# Patient Record
Sex: Female | Born: 1980 | Race: White | Hispanic: No | Marital: Married | Smoking: Never smoker
Health system: Southern US, Community
[De-identification: ages and names within clinical notes are randomized; demographics above are authoritative.]

## PROBLEM LIST (undated history)

## (undated) DIAGNOSIS — K429 Umbilical hernia without obstruction or gangrene: Secondary | ICD-10-CM

## (undated) DIAGNOSIS — F419 Anxiety disorder, unspecified: Secondary | ICD-10-CM

## (undated) DIAGNOSIS — E282 Polycystic ovarian syndrome: Secondary | ICD-10-CM

## (undated) DIAGNOSIS — F988 Other specified behavioral and emotional disorders with onset usually occurring in childhood and adolescence: Secondary | ICD-10-CM

## (undated) DIAGNOSIS — IMO0002 Reserved for concepts with insufficient information to code with codable children: Secondary | ICD-10-CM

## (undated) HISTORY — PX: NO PAST SURGERIES: SHX2092

## (undated) HISTORY — DX: Polycystic ovarian syndrome: E28.2

## (undated) HISTORY — DX: Other specified behavioral and emotional disorders with onset usually occurring in childhood and adolescence: F98.8

## (undated) HISTORY — DX: Anxiety disorder, unspecified: F41.9

## (undated) HISTORY — DX: Reserved for concepts with insufficient information to code with codable children: IMO0002

---

## 2009-06-10 DIAGNOSIS — IMO0002 Reserved for concepts with insufficient information to code with codable children: Secondary | ICD-10-CM

## 2009-06-10 HISTORY — DX: Reserved for concepts with insufficient information to code with codable children: IMO0002

## 2011-12-13 DIAGNOSIS — E282 Polycystic ovarian syndrome: Secondary | ICD-10-CM | POA: Insufficient documentation

## 2011-12-23 LAB — OB RESULTS CONSOLE HIV ANTIBODY (ROUTINE TESTING): HIV: NONREACTIVE

## 2011-12-23 LAB — OB RESULTS CONSOLE ABO/RH: RH Type: POSITIVE

## 2011-12-23 LAB — OB RESULTS CONSOLE GC/CHLAMYDIA
Chlamydia: NEGATIVE
Gonorrhea: NEGATIVE

## 2011-12-23 LAB — OB RESULTS CONSOLE RUBELLA ANTIBODY, IGM: Rubella: IMMUNE

## 2011-12-23 LAB — OB RESULTS CONSOLE HEPATITIS B SURFACE ANTIGEN: Hepatitis B Surface Ag: NEGATIVE

## 2011-12-23 LAB — OB RESULTS CONSOLE HGB/HCT, BLOOD: Hemoglobin: 11.8 g/dL

## 2012-01-17 ENCOUNTER — Ambulatory Visit (INDEPENDENT_AMBULATORY_CARE_PROVIDER_SITE_OTHER): Payer: BC Managed Care – PPO | Admitting: Family

## 2012-01-17 ENCOUNTER — Encounter: Payer: Self-pay | Admitting: Family

## 2012-01-17 VITALS — BP 90/57 | Temp 97.3°F | Ht 64.0 in | Wt 136.0 lb

## 2012-01-17 DIAGNOSIS — Z3490 Encounter for supervision of normal pregnancy, unspecified, unspecified trimester: Secondary | ICD-10-CM

## 2012-01-17 DIAGNOSIS — IMO0002 Reserved for concepts with insufficient information to code with codable children: Secondary | ICD-10-CM

## 2012-01-17 DIAGNOSIS — D369 Benign neoplasm, unspecified site: Secondary | ICD-10-CM | POA: Insufficient documentation

## 2012-01-17 DIAGNOSIS — Z87898 Personal history of other specified conditions: Secondary | ICD-10-CM | POA: Insufficient documentation

## 2012-01-17 DIAGNOSIS — Z3480 Encounter for supervision of other normal pregnancy, unspecified trimester: Secondary | ICD-10-CM

## 2012-01-17 DIAGNOSIS — F988 Other specified behavioral and emotional disorders with onset usually occurring in childhood and adolescence: Secondary | ICD-10-CM

## 2012-01-17 DIAGNOSIS — R6889 Other general symptoms and signs: Secondary | ICD-10-CM

## 2012-01-17 NOTE — Progress Notes (Signed)
p-[id number] 

## 2012-01-17 NOTE — Progress Notes (Signed)
Transfer from [place], desires waterbirth; picking a doula at this time.  Desires sex of baby to be unknown.  Reviewed medical/OB history, no concerns.  Declines genetic testing.  Derryl Harbor (daughter) starts kindergarten this year.

## 2012-01-28 ENCOUNTER — Encounter: Payer: Self-pay | Admitting: Family

## 2012-01-28 ENCOUNTER — Encounter: Payer: Self-pay | Admitting: *Deleted

## 2012-02-14 ENCOUNTER — Ambulatory Visit (INDEPENDENT_AMBULATORY_CARE_PROVIDER_SITE_OTHER): Payer: BC Managed Care – PPO | Admitting: Advanced Practice Midwife

## 2012-02-14 VITALS — BP 107/58 | Wt 143.0 lb

## 2012-02-14 DIAGNOSIS — Z3480 Encounter for supervision of other normal pregnancy, unspecified trimester: Secondary | ICD-10-CM

## 2012-02-14 DIAGNOSIS — O9989 Other specified diseases and conditions complicating pregnancy, childbirth and the puerperium: Secondary | ICD-10-CM

## 2012-02-14 DIAGNOSIS — R51 Headache: Secondary | ICD-10-CM

## 2012-02-14 DIAGNOSIS — O26899 Other specified pregnancy related conditions, unspecified trimester: Secondary | ICD-10-CM

## 2012-02-14 DIAGNOSIS — D369 Benign neoplasm, unspecified site: Secondary | ICD-10-CM

## 2012-02-14 DIAGNOSIS — Z3490 Encounter for supervision of normal pregnancy, unspecified, unspecified trimester: Secondary | ICD-10-CM

## 2012-02-14 MED ORDER — BUTALBITAL-APAP-CAFFEINE 50-325-40 MG PO TABS: 1.0000 | ORAL_TABLET | Freq: Four times a day (QID) | ORAL | Status: AC | PRN

## 2012-02-14 NOTE — Patient Instructions (Signed)
Pregnancy - Second Trimester The second trimester of pregnancy (3 to 6 months) is a period of rapid growth for you and your baby. At the end of the sixth month, your baby is about 9 inches long and weighs 1 1/2 pounds. You will begin to feel the baby move between 18 and 20 weeks of the pregnancy. This is called quickening. Weight gain is faster. A clear fluid (colostrum) may leak out of your breasts. You may feel small contractions of the womb (uterus). This is known as false labor or Braxton-Hicks contractions. This is like a practice for labor when the baby is ready to be born. Usually, the problems with morning sickness have usually passed by the end of your first trimester. Some women develop small dark blotches (called cholasma, mask of pregnancy) on their face that usually goes away after the baby is born. Exposure to the sun makes the blotches worse. Acne may also develop in some pregnant women and pregnant women who have acne, may find that it goes away. PRENATAL EXAMS  Blood work may continue to be done during prenatal exams. These tests are done to check on your health and the probable health of your baby. Blood work is used to follow your blood levels (hemoglobin). Anemia (low hemoglobin) is common during pregnancy. Iron and vitamins are given to help prevent this. You will also be checked for diabetes between 24 and 28 weeks of the pregnancy. Some of the previous blood tests may be repeated.   The size of the uterus is measured during each visit. This is to make sure that the baby is continuing to grow properly according to the dates of the pregnancy.   Your blood pressure is checked every prenatal visit. This is to make sure you are not getting toxemia.   Your urine is checked to make sure you do not have an infection, diabetes or protein in the urine.   Your weight is checked often to make sure gains are happening at the suggested rate. This is to ensure that both you and your baby are  growing normally.   Sometimes, an ultrasound is performed to confirm the proper growth and development of the baby. This is a test which bounces harmless sound waves off the baby so your caregiver can more accurately determine due dates.  Sometimes, a specialized test is done on the amniotic fluid surrounding the baby. This test is called an amniocentesis. The amniotic fluid is obtained by sticking a needle into the belly (abdomen). This is done to check the chromosomes in instances where there is a concern about possible genetic problems with the baby. It is also sometimes done near the end of pregnancy if an early delivery is required. In this case, it is done to help make sure the baby's lungs are mature enough for the baby to live outside of the womb. CHANGES OCCURING IN THE SECOND TRIMESTER OF PREGNANCY Your body goes through many changes during pregnancy. They vary from person to person. Talk to your caregiver about changes you notice that you are concerned about.  During the second trimester, you will likely have an increase in your appetite. It is normal to have cravings for certain foods. This varies from person to person and pregnancy to pregnancy.   Your lower abdomen will begin to bulge.   You may have to urinate more often because the uterus and baby are pressing on your bladder. It is also common to get more bladder infections during pregnancy (  pain with urination). You can help this by drinking lots of fluids and emptying your bladder before and after intercourse.   You may begin to get stretch marks on your hips, abdomen, and breasts. These are normal changes in the body during pregnancy. There are no exercises or medications to take that prevent this change.   You may begin to develop swollen and bulging veins (varicose veins) in your legs. Wearing support hose, elevating your feet for 15 minutes, 3 to 4 times a day and limiting salt in your diet helps lessen the problem.    Heartburn may develop as the uterus grows and pushes up against the stomach. Antacids recommended by your caregiver helps with this problem. Also, eating smaller meals 4 to 5 times a day helps.   Constipation can be treated with a stool softener or adding bulk to your diet. Drinking lots of fluids, vegetables, fruits, and whole grains are helpful.   Exercising is also helpful. If you have been very active up until your pregnancy, most of these activities can be continued during your pregnancy. If you have been less active, it is helpful to start an exercise program such as walking.   Hemorrhoids (varicose veins in the rectum) may develop at the end of the second trimester. Warm sitz baths and hemorrhoid cream recommended by your caregiver helps hemorrhoid problems.   Backaches may develop during this time of your pregnancy. Avoid heavy lifting, wear low heal shoes and practice good posture to help with backache problems.   Some pregnant women develop tingling and numbness of their hand and fingers because of swelling and tightening of ligaments in the wrist (carpel tunnel syndrome). This goes away after the baby is born.   As your breasts enlarge, you may have to get a bigger bra. Get a comfortable, cotton, support bra. Do not get a nursing bra until the last month of the pregnancy if you will be nursing the baby.   You may get a dark line from your belly button to the pubic area called the linea nigra.   You may develop rosy cheeks because of increase blood flow to the face.   You may develop spider looking lines of the face, neck, arms and chest. These go away after the baby is born.  HOME CARE INSTRUCTIONS   It is extremely important to avoid all smoking, herbs, alcohol, and unprescribed drugs during your pregnancy. These chemicals affect the formation and growth of the baby. Avoid these chemicals throughout the pregnancy to ensure the delivery of a healthy infant.   Most of your home  care instructions are the same as suggested for the first trimester of your pregnancy. Keep your caregiver's appointments. Follow your caregiver's instructions regarding medication use, exercise and diet.   During pregnancy, you are providing food for you and your baby. Continue to eat regular, well-balanced meals. Choose foods such as meat, fish, milk and other low fat dairy products, vegetables, fruits, and whole-grain breads and cereals. Your caregiver will tell you of the ideal weight gain.   A physical sexual relationship may be continued up until near the end of pregnancy if there are no other problems. Problems could include early (premature) leaking of amniotic fluid from the membranes, vaginal bleeding, abdominal pain, or other medical or pregnancy problems.   Exercise regularly if there are no restrictions. Check with your caregiver if you are unsure of the safety of some of your exercises. The greatest weight gain will occur in the   last 2 trimesters of pregnancy. Exercise will help you:   Control your weight.   Get you in shape for labor and delivery.   Lose weight after you have the baby.   Wear a good support or jogging bra for breast tenderness during pregnancy. This may help if worn during sleep. Pads or tissues may be used in the bra if you are leaking colostrum.   Do not use hot tubs, steam rooms or saunas throughout the pregnancy.   Wear your seat belt at all times when driving. This protects you and your baby if you are in an accident.   Avoid raw meat, uncooked cheese, cat litter boxes and soil used by cats. These carry germs that can cause birth defects in the baby.   The second trimester is also a good time to visit your dentist for your dental health if this has not been done yet. Getting your teeth cleaned is OK. Use a soft toothbrush. Brush gently during pregnancy.   It is easier to loose urine during pregnancy. Tightening up and strengthening the pelvic muscles will  help with this problem. Practice stopping your urination while you are going to the bathroom. These are the same muscles you need to strengthen. It is also the muscles you would use as if you were trying to stop from passing gas. You can practice tightening these muscles up 10 times a set and repeating this about 3 times per day. Once you know what muscles to tighten up, do not perform these exercises during urination. It is more likely to contribute to an infection by backing up the urine.   Ask for help if you have financial, counseling or nutritional needs during pregnancy. Your caregiver will be able to offer counseling for these needs as well as refer you for other special needs.   Your skin may become oily. If so, wash your face with mild soap, use non-greasy moisturizer and oil or cream based makeup.  MEDICATIONS AND DRUG USE IN PREGNANCY  Take prenatal vitamins as directed. The vitamin should contain 1 milligram of folic acid. Keep all vitamins out of reach of children. Only a couple vitamins or tablets containing iron may be fatal to a baby or young child when ingested.   Avoid use of all medications, including herbs, over-the-counter medications, not prescribed or suggested by your caregiver. Only take over-the-counter or prescription medicines for pain, discomfort, or fever as directed by your caregiver. Do not use aspirin.   Let your caregiver also know about herbs you may be using.   Alcohol is related to a number of birth defects. This includes fetal alcohol syndrome. All alcohol, in any form, should be avoided completely. Smoking will cause low birth rate and premature babies.   Street or illegal drugs are very harmful to the baby. They are absolutely forbidden. A baby born to an addicted mother will be addicted at birth. The baby will go through the same withdrawal an adult does.  SEEK MEDICAL CARE IF:  You have any concerns or worries during your pregnancy. It is better to call with  your questions if you feel they cannot wait, rather than worry about them. SEEK IMMEDIATE MEDICAL CARE IF:   An unexplained oral temperature above 102 F (38.9 C) develops, or as your caregiver suggests.   You have leaking of fluid from the vagina (birth canal). If leaking membranes are suspected, take your temperature and tell your caregiver of this when you call.   There   is vaginal spotting, bleeding, or passing clots. Tell your caregiver of the amount and how many pads are used. Light spotting in pregnancy is common, especially following intercourse.   You develop a bad smelling vaginal discharge with a change in the color from clear to white.   You continue to feel sick to your stomach (nauseated) and have no relief from remedies suggested. You vomit blood or coffee ground-like materials.   You lose more than 2 pounds of weight or gain more than 2 pounds of weight over 1 week, or as suggested by your caregiver.   You notice swelling of your face, hands, feet, or legs.   You get exposed to [place] measles and have never had them.   You are exposed to fifth disease or chickenpox.   You develop belly (abdominal) pain. Round ligament discomfort is a common non-cancerous (benign) cause of abdominal pain in pregnancy. Your caregiver still must evaluate you.   You develop a bad headache that does not go away.   You develop fever, diarrhea, pain with urination, or shortness of breath.   You develop visual problems, blurry, or double vision.   You fall or are in a car accident or any kind of trauma.   There is mental or physical violence at home.  Document Released: 05/21/2001 Document Revised: 05/16/2011 Document Reviewed: 11/23/2008 [facility] Patient Information 2012 [place], [place].  Tension Headache (Muscle Contraction Headache) Tension headache is one of the most common causes of head pain. These headaches are usually felt as a pain over the top of your head and back of your neck.  Stress, anxiety, and depression are common triggers for these headaches. Tension headaches are not life-threatening and will not lead to other types of headaches. Tension headaches can often be diagnosed by taking a history from the patient and a physical exam. Sometimes, further lab and x-ray studies are used to confirm the diagnosis. Your caregiver can advise you on how to get help solving problems that cause anxiety or stress. Antidepressants can be prescribed if depression is a problem. HOME CARE INSTRUCTIONS   If testing was done, call for your results. Remember, it is your responsibility to get the results of all testing. Do not assume everything is fine because you do not hear from your caregiver.   Only take over-the-counter or prescription medicines for pain, discomfort, or fever as directed by your caregiver.   Biofeedback, massage, or other relaxation techniques may be helpful.   Ice packs or heat to the head and neck can be used. Use these three to four times per day or as needed.   Physical therapy may be a useful addition to treatment.   If headaches continue, even with therapy, you may need to think about lifestyle changes.   Avoid excessive use of pain killers, as rebound headaches can occur.  SEEK MEDICAL CARE IF:   You develop problems with medications prescribed.   You do not respond or get no relief from medications.   You have a change from the usual headache.   You develop nausea (feeling sick to your stomach) or vomiting.  SEEK IMMEDIATE MEDICAL CARE IF:   Your headache becomes severe.   You have an unexplained oral temperature above 102 F (38.9 C).   You develop a stiff neck.   You have loss of vision.   You have muscular weakness.   You have loss of muscular control.   You develop severe symptoms different from your first symptoms.  You start losing your balance or have trouble walking.   You feel faint or pass out.  MAKE SURE YOU:    Understand these instructions.   Will watch your condition.   Will get help right away if you are not doing well or get worse.  Document Released: 05/27/2005 Document Revised: 05/16/2011 Document Reviewed: 01/14/2008 [facility] Patient Information 2012 [place], [place].

## 2012-02-14 NOTE — Progress Notes (Signed)
Routine ob check.  Patient states that she is having increased amount of braxton hicks, more than she felt with her previous pregnancy.

## 2012-02-15 DIAGNOSIS — O26899 Other specified pregnancy related conditions, unspecified trimester: Secondary | ICD-10-CM | POA: Insufficient documentation

## 2012-02-15 NOTE — Progress Notes (Signed)
Few BH UC's in morning. Some discomfort C/W round ligament pains. PTL precautions reviewed. Report significant HA's daily or every other day throughout most of pregnancy. Sometimes severe enough to have to leave work. Hx migraines ~2x/year. Current HA's are different location (left side of neck extending up to left temple and not associated w/ N/V. Denies aura, fever, weakness, difficulties w/ speech or gate. Tylenol, caffeine not helping. Feels knot on left side of neck. Recommend heat and massage, ibuprofen PRN in second trimester, Rx Fiocicet. Referral to Jannifer Rodney, NP. ED if HA severe, not responding to interventions. Anatomy US scheduled.

## 2012-02-24 ENCOUNTER — Ambulatory Visit (HOSPITAL_COMMUNITY)
Admission: RE | Admit: 2012-02-24 | Discharge: 2012-02-24 | Disposition: A | Discharge status: Home / Self Care | Payer: BC Managed Care – PPO | Source: Ambulatory Visit | Attending: Advanced Practice Midwife | Admitting: Advanced Practice Midwife

## 2012-02-24 ENCOUNTER — Ambulatory Visit (HOSPITAL_COMMUNITY): Payer: BC Managed Care – PPO

## 2012-02-24 DIAGNOSIS — Z3490 Encounter for supervision of normal pregnancy, unspecified, unspecified trimester: Secondary | ICD-10-CM

## 2012-02-24 DIAGNOSIS — O358XX0 Maternal care for other (suspected) fetal abnormality and damage, not applicable or unspecified: Secondary | ICD-10-CM | POA: Insufficient documentation

## 2012-02-24 DIAGNOSIS — Z363 Encounter for antenatal screening for malformations: Secondary | ICD-10-CM | POA: Insufficient documentation

## 2012-02-24 DIAGNOSIS — Z1389 Encounter for screening for other disorder: Secondary | ICD-10-CM | POA: Insufficient documentation

## 2012-02-27 ENCOUNTER — Encounter: Payer: Self-pay | Admitting: Advanced Practice Midwife

## 2012-03-10 ENCOUNTER — Ambulatory Visit (INDEPENDENT_AMBULATORY_CARE_PROVIDER_SITE_OTHER): Payer: BC Managed Care – PPO | Admitting: Nurse Practitioner

## 2012-03-10 ENCOUNTER — Encounter: Payer: Self-pay | Admitting: Nurse Practitioner

## 2012-03-10 ENCOUNTER — Ambulatory Visit: Payer: BC Managed Care – PPO | Attending: Nurse Practitioner | Admitting: Physical Therapy

## 2012-03-10 VITALS — BP 108/62 | HR 90 | Temp 96.6°F | Resp 16 | Ht 64.0 in | Wt 151.0 lb

## 2012-03-10 DIAGNOSIS — F419 Anxiety disorder, unspecified: Secondary | ICD-10-CM

## 2012-03-10 DIAGNOSIS — G43009 Migraine without aura, not intractable, without status migrainosus: Secondary | ICD-10-CM

## 2012-03-10 DIAGNOSIS — F411 Generalized anxiety disorder: Secondary | ICD-10-CM

## 2012-03-10 DIAGNOSIS — IMO0001 Reserved for inherently not codable concepts without codable children: Secondary | ICD-10-CM | POA: Insufficient documentation

## 2012-03-10 DIAGNOSIS — M62838 Other muscle spasm: Secondary | ICD-10-CM

## 2012-03-10 DIAGNOSIS — M542 Cervicalgia: Secondary | ICD-10-CM | POA: Insufficient documentation

## 2012-03-10 DIAGNOSIS — M2560 Stiffness of unspecified joint, not elsewhere classified: Secondary | ICD-10-CM | POA: Insufficient documentation

## 2012-03-10 MED ORDER — CYCLOBENZAPRINE HCL 10 MG PO TABS: 10.0000 mg | ORAL_TABLET | Freq: Every day | ORAL | Status: DC

## 2012-03-10 NOTE — Patient Instructions (Signed)
Migraine Headache A migraine headache is an intense, throbbing pain on one or both sides of your head. A migraine can last for 30 minutes to several hours. CAUSES  The exact cause of a migraine headache is not always known. However, a migraine may be caused when nerves in the brain become irritated and release chemicals that cause inflammation. This causes pain. SYMPTOMS  Pain on one or both sides of your head.  Pulsating or throbbing pain.  Severe pain that prevents daily activities.  Pain that is aggravated by any physical activity.  Nausea, vomiting, or both.  Dizziness.  Pain with exposure to bright lights, loud noises, or activity.  General sensitivity to bright lights, loud noises, or smells. Before you get a migraine, you may get warning signs that a migraine is coming (aura). An aura may include:  Seeing flashing lights.  Seeing bright spots, halos, or zig-zag lines.  Having tunnel vision or blurred vision.  Having feelings of numbness or tingling.  Having trouble talking.  Having muscle weakness. MIGRAINE TRIGGERS  Alcohol.  Smoking.  Stress.  Menstruation.  Aged cheeses.  Foods or drinks that contain nitrates, glutamate, aspartame, or tyramine.  Lack of sleep.  Chocolate.  Caffeine.  Hunger.  Physical exertion.  Fatigue.  Medicines used to treat chest pain (nitroglycerine), birth control pills, estrogen, and some blood pressure medicines. DIAGNOSIS  A migraine headache is often diagnosed based on:  Symptoms.  Physical examination.  A CT scan or MRI of your head. TREATMENT Medicines may be given for pain and nausea. Medicines can also be given to help prevent recurrent migraines.  HOME CARE INSTRUCTIONS  Only take over-the-counter or prescription medicines for pain or discomfort as directed by your caregiver. The use of long-term narcotics is not recommended.  Lie down in a dark, quiet room when you have a migraine.  Keep a journal  to find out what may trigger your migraine headaches. For example, write down:  What you eat and drink.  How much sleep you get.  Any change to your diet or medicines.  Limit alcohol consumption.  Quit smoking if you smoke.  Get 7 to 9 hours of sleep, or as recommended by your caregiver.  Limit stress.  Keep lights dim if bright lights bother you and make your migraines worse. SEEK IMMEDIATE MEDICAL CARE IF:   Your migraine becomes severe.  You have a fever.  You have a stiff neck.  You have vision loss.  You have muscular weakness or loss of muscle control.  You start losing your balance or have trouble walking.  You feel faint or pass out.  You have severe symptoms that are different from your first symptoms. MAKE SURE YOU:   Understand these instructions.  Will watch your condition.  Will get help right away if you are not doing well or get worse. Document Released: 05/27/2005 Document Revised: 08/19/2011 Document Reviewed: 05/17/2011 [employer] Patient Information 2013 [employer].  

## 2012-03-10 NOTE — Progress Notes (Signed)
Diagnosis:Migraine without Aura  History: New headache patient today. She first noticed migraine after the birth of first daughter 5 years ago. She would have a migraine without aura every 3-4 months and would control them with OTC meds. Her mother has migraine with aura. She is currently [redacted] weeks pregnant with a planned pregnancy. She does have stressor in her life including full time work, 29 yo daughter, house move, husband not working and the upcoming adoption of her daughter by her new husband.This in addition to new baby has increased her already present anxiety. She noticed she was having a daily migraine after the 13th week of pregnancy. She has been given butalbital for pain with is mildly helpful.    Location:Left Temple and Occipital  Number of Headache days/month: Severe:0 Moderate:30 Mild:0  Current Outpatient Prescriptions on File Prior to Visit  Medication Sig Dispense Refill  . albuterol (PROVENTIL HFA;VENTOLIN HFA) 108 (90 BASE) MCG/ACT inhaler Inhale 2 puffs into the lungs every 6 (six) hours as needed.      Marland Kitchen PRENATAL VITAMINS PO Take by mouth daily.        Acute prevention: Butalbital  Past Medical History  Diagnosis Date  . Abnormal cervical Pap smear with positive HPV DNA test 2011    colpo w/cx bx  . Anxiety   . ADD (attention deficit disorder)   . PCOS (polycystic ovarian syndrome)    History reviewed. No pertinent past surgical history. History reviewed. No pertinent family history. Social History:  does not have a smoking history on file. She has never used smokeless tobacco. She reports that she does not drink alcohol or use illicit drugs. Allergies: No Known Allergies  Triggers:stress  Birth control:pregnancy  ROS: Positive for migraine, muscle spasm, anxiety. Negative for chest pain, SOB, abdominal pain, vaginal bleeding   Exam: Well developed, well nourished, caucasian female that is 21 weeks and 1 day pregnant  General:  Anxious HEENT:negative Cardiac:negative Lungs:negative Neuro:negative Skin:negative Muscle: Tenderness in left occipital, trap,and temple areas  Procedure Note: See Trigger point injection note. 10 cc injected in 1 cc amounts over left temple, occipital, trap and neck  Impression:migraine - common, mixed tension and migraine headache  Plan: We had a lengthy discussion today including the pathophysiology of migraine and recognizing her triggers. She has opted for trigger point injections for left occipital, temple and trapezius regions. She will also be called in flexeril to take nightly until the headache cycle breaks. She was able to get a physical therapy appointment today to work on her very tight muscles in her neck. She will consider revisiting her counselor to help decrease her anxiety levels. She will use ice and heat as needed. She will be seeing the Midwife next week and will report to her how she is doing with headaches.   Time Spent: one hour

## 2012-03-12 ENCOUNTER — Encounter: Payer: BC Managed Care – PPO | Admitting: Physical Therapy

## 2012-03-17 ENCOUNTER — Ambulatory Visit: Payer: BC Managed Care – PPO | Admitting: Physical Therapy

## 2012-03-20 ENCOUNTER — Ambulatory Visit: Payer: BC Managed Care – PPO | Admitting: Physical Therapy

## 2012-03-20 ENCOUNTER — Ambulatory Visit (INDEPENDENT_AMBULATORY_CARE_PROVIDER_SITE_OTHER): Payer: BC Managed Care – PPO | Admitting: Family

## 2012-03-20 VITALS — BP 104/63 | Wt 151.0 lb

## 2012-03-20 DIAGNOSIS — O26899 Other specified pregnancy related conditions, unspecified trimester: Secondary | ICD-10-CM

## 2012-03-20 DIAGNOSIS — Z3480 Encounter for supervision of other normal pregnancy, unspecified trimester: Secondary | ICD-10-CM

## 2012-03-20 DIAGNOSIS — R51 Headache: Secondary | ICD-10-CM

## 2012-03-20 DIAGNOSIS — O9989 Other specified diseases and conditions complicating pregnancy, childbirth and the puerperium: Secondary | ICD-10-CM

## 2012-03-20 NOTE — Progress Notes (Signed)
p=71 

## 2012-03-20 NOTE — Progress Notes (Signed)
Appt with Candy Sledge for migraines, + physical therapy appts, will try this first, if minimal relief will reschedule with Bonita Quin; attended waterbirth class, + doula Corrie Dandy).  1 hr at next visit.

## 2012-03-24 ENCOUNTER — Ambulatory Visit: Payer: BC Managed Care – PPO | Admitting: Physical Therapy

## 2012-03-26 ENCOUNTER — Ambulatory Visit: Payer: BC Managed Care – PPO | Admitting: Physical Therapy

## 2012-03-31 ENCOUNTER — Encounter: Payer: Self-pay | Admitting: Physical Therapy

## 2012-04-02 ENCOUNTER — Ambulatory Visit: Payer: BC Managed Care – PPO | Admitting: Physical Therapy

## 2012-04-06 ENCOUNTER — Ambulatory Visit: Payer: BC Managed Care – PPO | Admitting: Physical Therapy

## 2012-04-17 ENCOUNTER — Ambulatory Visit (INDEPENDENT_AMBULATORY_CARE_PROVIDER_SITE_OTHER): Payer: BC Managed Care – PPO | Admitting: Family

## 2012-04-17 VITALS — BP 106/60 | Wt 159.0 lb

## 2012-04-17 DIAGNOSIS — Z3480 Encounter for supervision of other normal pregnancy, unspecified trimester: Secondary | ICD-10-CM

## 2012-04-17 LAB — CBC
MCV: 90.5 fL (ref 78.0–100.0)
Platelets: 223 10*3/uL (ref 150–400)
RBC: 3.48 MIL/uL — ABNORMAL LOW (ref 3.87–5.11)
WBC: 10.1 10*3/uL (ref 4.0–10.5)

## 2012-04-17 NOTE — Progress Notes (Signed)
p-79  Lt sciatic pain  28 week labs today

## 2012-04-17 NOTE — Progress Notes (Signed)
States feeling really good; headaches have improved, may see physical therapy for back pain; 1 hr today; braxton hicks 2-3x day.

## 2012-04-18 LAB — HIV ANTIBODY (ROUTINE TESTING W REFLEX): HIV: NONREACTIVE

## 2012-04-18 LAB — RPR

## 2012-04-18 LAB — GLUCOSE TOLERANCE, 1 HOUR (50G) W/O FASTING: Glucose, 1 Hour GTT: 112 mg/dL (ref 70–140)

## 2012-04-19 ENCOUNTER — Encounter: Payer: Self-pay | Admitting: Family

## 2012-04-20 ENCOUNTER — Telehealth: Payer: Self-pay | Admitting: *Deleted

## 2012-04-20 NOTE — Telephone Encounter (Signed)
LM on voicemail that she passed her 1 hr GTT. 

## 2012-05-01 ENCOUNTER — Ambulatory Visit (INDEPENDENT_AMBULATORY_CARE_PROVIDER_SITE_OTHER): Payer: BC Managed Care – PPO | Admitting: Advanced Practice Midwife

## 2012-05-01 VITALS — BP 99/54 | Temp 97.1°F | Wt 162.0 lb

## 2012-05-01 DIAGNOSIS — Z3480 Encounter for supervision of other normal pregnancy, unspecified trimester: Secondary | ICD-10-CM

## 2012-05-01 DIAGNOSIS — Z3490 Encounter for supervision of normal pregnancy, unspecified, unspecified trimester: Secondary | ICD-10-CM

## 2012-05-01 NOTE — Patient Instructions (Signed)
Pregnancy - Third Trimester  The third trimester of pregnancy (the last 3 months) is a period of the most rapid growth for you and your baby. The baby approaches a length of 20 inches and a weight of 6 to 10 pounds. The baby is adding on fat and getting ready for life outside your body. While inside, babies have periods of sleeping and waking, suck their thumbs, and hiccups. You can often feel small contractions of the uterus. This is false labor. It is also called Braxton-Hicks contractions. This is like a practice for labor. The usual problems in this stage of pregnancy include more difficulty breathing, swelling of the hands and feet from water retention, and having to urinate more often because of the uterus and baby pressing on your bladder.   PRENATAL EXAMS  · Blood work may continue to be done during prenatal exams. These tests are done to check on your health and the probable health of your baby. Blood work is used to follow your blood levels (hemoglobin). Anemia (low hemoglobin) is common during pregnancy. Iron and vitamins are given to help prevent this. You may also continue to be checked for diabetes. Some of the past blood tests may be done again.  · The size of the uterus is measured during each visit. This makes sure your baby is growing properly according to your pregnancy dates.  · Your blood pressure is checked every prenatal visit. This is to make sure you are not getting toxemia.  · Your urine is checked every prenatal visit for infection, diabetes and protein.  · Your weight is checked at each visit. This is done to make sure gains are happening at the suggested rate and that you and your baby are growing normally.  · Sometimes, an ultrasound is performed to confirm the position and the proper growth and development of the baby. This is a test done that bounces harmless sound waves off the baby so your caregiver can more accurately determine due dates.  · Discuss the type of pain medication and  anesthesia you will have during your labor and delivery.  · Discuss the possibility and anesthesia if a Cesarean Section might be necessary.  · Inform your caregiver if there is any mental or physical violence at home.  Sometimes, a specialized non-stress test, contraction stress test and biophysical profile are done to make sure the baby is not having a problem. Checking the amniotic fluid surrounding the baby is called an amniocentesis. The amniotic fluid is removed by sticking a needle into the belly (abdomen). This is sometimes done near the end of pregnancy if an early delivery is required. In this case, it is done to help make sure the baby's lungs are mature enough for the baby to live outside of the womb. If the lungs are not mature and it is unsafe to deliver the baby, an injection of cortisone medication is given to the mother 1 to 2 days before the delivery. This helps the baby's lungs mature and makes it safer to deliver the baby.  CHANGES OCCURING IN THE THIRD TRIMESTER OF PREGNANCY  Your body goes through many changes during pregnancy. They vary from person to person. Talk to your caregiver about changes you notice and are concerned about.  · During the last trimester, you have probably had an increase in your appetite. It is normal to have cravings for certain foods. This varies from person to person and pregnancy to pregnancy.  · You may begin to   get stretch marks on your hips, abdomen, and breasts. These are normal changes in the body during pregnancy. There are no exercises or medications to take which prevent this change.  · Constipation may be treated with a stool softener or adding bulk to your diet. Drinking lots of fluids, fiber in vegetables, fruits, and whole grains are helpful.  · Exercising is also helpful. If you have been very active up until your pregnancy, most of these activities can be continued during your pregnancy. If you have been less active, it is helpful to start an exercise  program such as walking. Consult your caregiver before starting exercise programs.  · Avoid all smoking, alcohol, un-prescribed drugs, herbs and "street drugs" during your pregnancy. These chemicals affect the formation and growth of the baby. Avoid chemicals throughout the pregnancy to ensure the delivery of a healthy infant.  · Backache, varicose veins and hemorrhoids may develop or get worse.  · You will tire more easily in the third trimester, which is normal.  · The baby's movements may be stronger and more often.  · You may become short of breath easily.  · Your belly button may stick out.  · A yellow discharge may leak from your breasts called colostrum.  · You may have a bloody mucus discharge. This usually occurs a few days to a week before labor begins.  HOME CARE INSTRUCTIONS   · Keep your caregiver's appointments. Follow your caregiver's instructions regarding medication use, exercise, and diet.  · During pregnancy, you are providing food for you and your baby. Continue to eat regular, well-balanced meals. Choose foods such as meat, fish, milk and other low fat dairy products, vegetables, fruits, and whole-grain breads and cereals. Your caregiver will tell you of the ideal weight gain.  · A physical sexual relationship may be continued throughout pregnancy if there are no other problems such as early (premature) leaking of amniotic fluid from the membranes, vaginal bleeding, or belly (abdominal) pain.  · Exercise regularly if there are no restrictions. Check with your caregiver if you are unsure of the safety of your exercises. Greater weight gain will occur in the last 2 trimesters of pregnancy. Exercising helps:  · Control your weight.  · Get you in shape for labor and delivery.  · You lose weight after you deliver.  · Rest a lot with legs elevated, or as needed for leg cramps or low back pain.  · Wear a good support or jogging bra for breast tenderness during pregnancy. This may help if worn during  sleep. Pads or tissues may be used in the bra if you are leaking colostrum.  · Do not use hot tubs, steam rooms, or saunas.  · Wear your seat belt when driving. This protects you and your baby if you are in an accident.  · Avoid raw meat, cat litter boxes and soil used by cats. These carry germs that can cause birth defects in the baby.  · It is easier to loose urine during pregnancy. Tightening up and strengthening the pelvic muscles will help with this problem. You can practice stopping your urination while you are going to the bathroom. These are the same muscles you need to strengthen. It is also the muscles you would use if you were trying to stop from passing gas. You can practice tightening these muscles up 10 times a set and repeating this about 3 times per day. Once you know what muscles to tighten up, do not perform these   exercises during urination. It is more likely to cause an infection by backing up the urine.  · Ask for help if you have financial, counseling or nutritional needs during pregnancy. Your caregiver will be able to offer counseling for these needs as well as refer you for other special needs.  · Make a list of emergency phone numbers and have them available.  · Plan on getting help from family or friends when you go home from the hospital.  · Make a trial run to the hospital.  · Take prenatal classes with the father to understand, practice and ask questions about the labor and delivery.  · Prepare the baby's room/nursery.  · Do not travel out of the city unless it is absolutely necessary and with the advice of your caregiver.  · Wear only low or no heal shoes to have better balance and prevent falling.  MEDICATIONS AND DRUG USE IN PREGNANCY  · Take prenatal vitamins as directed. The vitamin should contain 1 milligram of folic acid. Keep all vitamins out of reach of children. Only a couple vitamins or tablets containing iron may be fatal to a baby or young child when ingested.  · Avoid use  of all medications, including herbs, over-the-counter medications, not prescribed or suggested by your caregiver. Only take over-the-counter or prescription medicines for pain, discomfort, or fever as directed by your caregiver. Do not use aspirin, ibuprofen (Motrin®, Advil®, Nuprin®) or naproxen (Aleve®) unless OK'd by your caregiver.  · Let your caregiver also know about herbs you may be using.  · Alcohol is related to a number of birth defects. This includes fetal alcohol syndrome. All alcohol, in any form, should be avoided completely. Smoking will cause low birth rate and premature babies.  · Street/illegal drugs are very harmful to the baby. They are absolutely forbidden. A baby born to an addicted mother will be addicted at birth. The baby will go through the same withdrawal an adult does.  SEEK MEDICAL CARE IF:  You have any concerns or worries during your pregnancy. It is better to call with your questions if you feel they cannot wait, rather than worry about them.  DECISIONS ABOUT CIRCUMCISION  You may or may not know the sex of your baby. If you know your baby is a boy, it may be time to think about circumcision. Circumcision is the removal of the foreskin of the penis. This is the skin that covers the sensitive end of the penis. There is no proven medical need for this. Often this decision is made on what is popular at the time or based upon religious beliefs and social issues. You can discuss these issues with your caregiver or pediatrician.  SEEK IMMEDIATE MEDICAL CARE IF:   · An unexplained oral temperature above 102° F (38.9° C) develops, or as your caregiver suggests.  · You have leaking of fluid from the vagina (birth canal). If leaking membranes are suspected, take your temperature and tell your caregiver of this when you call.  · There is vaginal spotting, bleeding or passing clots. Tell your caregiver of the amount and how many pads are used.  · You develop a bad smelling vaginal discharge with  a change in the color from clear to white.  · You develop vomiting that lasts more than 24 hours.  · You develop chills or fever.  · You develop shortness of breath.  · You develop burning on urination.  · You loose more than 2 pounds of weight   or gain more than 2 pounds of weight or as suggested by your caregiver.  · You notice sudden swelling of your face, hands, and feet or legs.  · You develop belly (abdominal) pain. Round ligament discomfort is a common non-cancerous (benign) cause of abdominal pain in pregnancy. Your caregiver still must evaluate you.  · You develop a severe headache that does not go away.  · You develop visual problems, blurred or double vision.  · If you have not felt your baby move for more than 1 hour. If you think the baby is not moving as much as usual, eat something with sugar in it and lie down on your left side for an hour. The baby should move at least 4 to 5 times per hour. Call right away if your baby moves less than that.  · You fall, are in a car accident or any kind of trauma.  · There is mental or physical violence at home.  Document Released: 05/21/2001 Document Revised: 08/19/2011 Document Reviewed: 11/23/2008  Exit[employer]® Patient Information ©2013 [employer].

## 2012-05-01 NOTE — Progress Notes (Signed)
p-81 Sciatic nerve pain better.  Doing Yoga which helps

## 2012-05-18 ENCOUNTER — Ambulatory Visit (INDEPENDENT_AMBULATORY_CARE_PROVIDER_SITE_OTHER): Payer: BC Managed Care – PPO | Admitting: Advanced Practice Midwife

## 2012-05-18 VITALS — BP 98/54 | Wt 167.0 lb

## 2012-05-18 DIAGNOSIS — Z3480 Encounter for supervision of other normal pregnancy, unspecified trimester: Secondary | ICD-10-CM

## 2012-05-18 DIAGNOSIS — Z3490 Encounter for supervision of normal pregnancy, unspecified, unspecified trimester: Secondary | ICD-10-CM

## 2012-05-18 NOTE — Patient Instructions (Addendum)
[facility]  Iron-Rich Diet An iron-rich diet contains foods that are good sources of iron. Iron is an important mineral that helps your body produce hemoglobin. Hemoglobin is a protein in red blood cells that carries oxygen to the body's tissues. Sometimes, the iron level in your blood can be low. This may be caused by:  A lack of iron in your diet.  Blood loss.  Times of growth, such as during pregnancy or during a child's growth and development. Low levels of iron can cause a decrease in the number of red blood cells. This can result in iron deficiency anemia. Iron deficiency anemia symptoms include:  Tiredness.  Weakness.  Irritability.  Increased chance of infection. Here are some recommendations for daily iron intake:  Males older than 29 years of age need 8 mg of iron per day.  Women ages 24 to 59 need 18 mg of iron per day.  Pregnant women need 27 mg of iron per day, and women who are over 8 years of age and breastfeeding need 9 mg of iron per day.  Women over the age of 80 need 8 mg of iron per day. SOURCES OF IRON There are 2 types of iron that are found in food: heme iron and nonheme iron. Heme iron is absorbed by the body better than nonheme iron. Heme iron is found in meat, poultry, and fish. Nonheme iron is found in grains, beans, and vegetables. Heme Iron Sources Food / Iron (mg)  Chicken liver, 3 oz (85 g)/ 10 mg  Beef liver, 3 oz (85 g)/ 5.5 mg  Oysters, 3 oz (85 g)/ 8 mg  Beef, 3 oz (85 g)/ 2 to 3 mg  Shrimp, 3 oz (85 g)/ 2.8 mg  [place], 3 oz (85 g)/ 2 mg  Chicken, 3 oz (85 g) / 1 mg  Fish (tuna, halibut), 3 oz (85 g)/ 1 mg  Pork, 3 oz (85 g)/ 0.9 mg Nonheme Iron Sources Food / Iron (mg)  Ready-to-eat breakfast cereal, iron-fortified / 3.9 to 7 mg  Tofu,  cup / 3.4 mg  Kidney beans,  cup / 2.6 mg  Baked potato with skin / 2.7 mg  Asparagus,  cup / 2.2 mg  Avocado / 2 mg  Dried peaches,  cup / 1.6 mg  Raisins,  cup / 1.5 mg  Soy  milk, 1 cup / 1.5 mg  Whole-wheat bread, 1 slice / 1.2 mg  Spinach, 1 cup / 0.8 mg  Broccoli,  cup / 0.6 mg IRON ABSORPTION Certain foods can decrease the body's absorption of iron. Try to avoid these foods and beverages while eating meals with iron-containing foods:  Coffee.  Tea.  Fiber.  Soy. Foods containing vitamin C can help increase the amount of iron your body absorbs from iron sources, especially from nonheme sources. Eat foods with vitamin C along with iron-containing foods to increase your iron absorption. Foods that are high in vitamin C include many fruits and vegetables. Some good sources are:  Fresh orange juice.  Oranges.  Strawberries.  Mangoes.  Grapefruit.  Red bell peppers.  Green bell peppers.  Broccoli.  Potatoes with skin.  Tomato juice. Document Released: 01/08/2005 Document Revised: 08/19/2011 Document Reviewed: 11/15/2010 [facility] Patient Information 2013 [place], [place].

## 2012-05-18 NOTE — Progress Notes (Signed)
Doula has waterbirth supplies. Wants to have CNM from [facility] at birth. Recommended that she request [place] when she comes in labor, but we cannot promise.

## 2012-05-18 NOTE — Progress Notes (Signed)
p-84  BH getting more frequent.

## 2012-06-01 ENCOUNTER — Ambulatory Visit (INDEPENDENT_AMBULATORY_CARE_PROVIDER_SITE_OTHER): Payer: BC Managed Care – PPO | Admitting: Advanced Practice Midwife

## 2012-06-01 VITALS — BP 99/62 | Wt 170.0 lb

## 2012-06-01 DIAGNOSIS — Z3480 Encounter for supervision of other normal pregnancy, unspecified trimester: Secondary | ICD-10-CM

## 2012-06-01 NOTE — Progress Notes (Signed)
Reports pelvic girdle pain - has maternity support, but it's uncomfortable. No other c/o at this time. Rev'd PTL and fetal movement.

## 2012-06-01 NOTE — Progress Notes (Signed)
p-96  Bad pelvic girdle pain.  Increase in nose bleeds.

## 2012-06-01 NOTE — Patient Instructions (Signed)
Pregnancy - Third Trimester  The third trimester of pregnancy (the last 3 months) is a period of the most rapid growth for you and your baby. The baby approaches a length of 20 inches and a weight of 6 to 10 pounds. The baby is adding on fat and getting ready for life outside your body. While inside, babies have periods of sleeping and waking, suck their thumbs, and hiccups. You can often feel small contractions of the uterus. This is false labor. It is also called Braxton-Hicks contractions. This is like a practice for labor. The usual problems in this stage of pregnancy include more difficulty breathing, swelling of the hands and feet from water retention, and having to urinate more often because of the uterus and baby pressing on your bladder.   PRENATAL EXAMS  · Blood work may continue to be done during prenatal exams. These tests are done to check on your health and the probable health of your baby. Blood work is used to follow your blood levels (hemoglobin). Anemia (low hemoglobin) is common during pregnancy. Iron and vitamins are given to help prevent this. You may also continue to be checked for diabetes. Some of the past blood tests may be done again.  · The size of the uterus is measured during each visit. This makes sure your baby is growing properly according to your pregnancy dates.  · Your blood pressure is checked every prenatal visit. This is to make sure you are not getting toxemia.  · Your urine is checked every prenatal visit for infection, diabetes and protein.  · Your weight is checked at each visit. This is done to make sure gains are happening at the suggested rate and that you and your baby are growing normally.  · Sometimes, an ultrasound is performed to confirm the position and the proper growth and development of the baby. This is a test done that bounces harmless sound waves off the baby so your caregiver can more accurately determine due dates.  · Discuss the type of pain medication and  anesthesia you will have during your labor and delivery.  · Discuss the possibility and anesthesia if a Cesarean Section might be necessary.  · Inform your caregiver if there is any mental or physical violence at home.  Sometimes, a specialized non-stress test, contraction stress test and biophysical profile are done to make sure the baby is not having a problem. Checking the amniotic fluid surrounding the baby is called an amniocentesis. The amniotic fluid is removed by sticking a needle into the belly (abdomen). This is sometimes done near the end of pregnancy if an early delivery is required. In this case, it is done to help make sure the baby's lungs are mature enough for the baby to live outside of the womb. If the lungs are not mature and it is unsafe to deliver the baby, an injection of cortisone medication is given to the mother 1 to 2 days before the delivery. This helps the baby's lungs mature and makes it safer to deliver the baby.  CHANGES OCCURING IN THE THIRD TRIMESTER OF PREGNANCY  Your body goes through many changes during pregnancy. They vary from person to person. Talk to your caregiver about changes you notice and are concerned about.  · During the last trimester, you have probably had an increase in your appetite. It is normal to have cravings for certain foods. This varies from person to person and pregnancy to pregnancy.  · You may begin to   get stretch marks on your hips, abdomen, and breasts. These are normal changes in the body during pregnancy. There are no exercises or medications to take which prevent this change.  · Constipation may be treated with a stool softener or adding bulk to your diet. Drinking lots of fluids, fiber in vegetables, fruits, and whole grains are helpful.  · Exercising is also helpful. If you have been very active up until your pregnancy, most of these activities can be continued during your pregnancy. If you have been less active, it is helpful to start an exercise  program such as walking. Consult your caregiver before starting exercise programs.  · Avoid all smoking, alcohol, un-prescribed drugs, herbs and "street drugs" during your pregnancy. These chemicals affect the formation and growth of the baby. Avoid chemicals throughout the pregnancy to ensure the delivery of a healthy infant.  · Backache, varicose veins and hemorrhoids may develop or get worse.  · You will tire more easily in the third trimester, which is normal.  · The baby's movements may be stronger and more often.  · You may become short of breath easily.  · Your belly button may stick out.  · A yellow discharge may leak from your breasts called colostrum.  · You may have a bloody mucus discharge. This usually occurs a few days to a week before labor begins.  HOME CARE INSTRUCTIONS   · Keep your caregiver's appointments. Follow your caregiver's instructions regarding medication use, exercise, and diet.  · During pregnancy, you are providing food for you and your baby. Continue to eat regular, well-balanced meals. Choose foods such as meat, fish, milk and other low fat dairy products, vegetables, fruits, and whole-grain breads and cereals. Your caregiver will tell you of the ideal weight gain.  · A physical sexual relationship may be continued throughout pregnancy if there are no other problems such as early (premature) leaking of amniotic fluid from the membranes, vaginal bleeding, or belly (abdominal) pain.  · Exercise regularly if there are no restrictions. Check with your caregiver if you are unsure of the safety of your exercises. Greater weight gain will occur in the last 2 trimesters of pregnancy. Exercising helps:  · Control your weight.  · Get you in shape for labor and delivery.  · You lose weight after you deliver.  · Rest a lot with legs elevated, or as needed for leg cramps or low back pain.  · Wear a good support or jogging bra for breast tenderness during pregnancy. This may help if worn during  sleep. Pads or tissues may be used in the bra if you are leaking colostrum.  · Do not use hot tubs, steam rooms, or saunas.  · Wear your seat belt when driving. This protects you and your baby if you are in an accident.  · Avoid raw meat, cat litter boxes and soil used by cats. These carry germs that can cause birth defects in the baby.  · It is easier to loose urine during pregnancy. Tightening up and strengthening the pelvic muscles will help with this problem. You can practice stopping your urination while you are going to the bathroom. These are the same muscles you need to strengthen. It is also the muscles you would use if you were trying to stop from passing gas. You can practice tightening these muscles up 10 times a set and repeating this about 3 times per day. Once you know what muscles to tighten up, do not perform these   exercises during urination. It is more likely to cause an infection by backing up the urine.  · Ask for help if you have financial, counseling or nutritional needs during pregnancy. Your caregiver will be able to offer counseling for these needs as well as refer you for other special needs.  · Make a list of emergency phone numbers and have them available.  · Plan on getting help from family or friends when you go home from the hospital.  · Make a trial run to the hospital.  · Take prenatal classes with the father to understand, practice and ask questions about the labor and delivery.  · Prepare the baby's room/nursery.  · Do not travel out of the city unless it is absolutely necessary and with the advice of your caregiver.  · Wear only low or no heal shoes to have better balance and prevent falling.  MEDICATIONS AND DRUG USE IN PREGNANCY  · Take prenatal vitamins as directed. The vitamin should contain 1 milligram of folic acid. Keep all vitamins out of reach of children. Only a couple vitamins or tablets containing iron may be fatal to a baby or young child when ingested.  · Avoid use  of all medications, including herbs, over-the-counter medications, not prescribed or suggested by your caregiver. Only take over-the-counter or prescription medicines for pain, discomfort, or fever as directed by your caregiver. Do not use aspirin, ibuprofen (Motrin®, Advil®, Nuprin®) or naproxen (Aleve®) unless OK'd by your caregiver.  · Let your caregiver also know about herbs you may be using.  · Alcohol is related to a number of birth defects. This includes fetal alcohol syndrome. All alcohol, in any form, should be avoided completely. Smoking will cause low birth rate and premature babies.  · Street/illegal drugs are very harmful to the baby. They are absolutely forbidden. A baby born to an addicted mother will be addicted at birth. The baby will go through the same withdrawal an adult does.  SEEK MEDICAL CARE IF:  You have any concerns or worries during your pregnancy. It is better to call with your questions if you feel they cannot wait, rather than worry about them.  DECISIONS ABOUT CIRCUMCISION  You may or may not know the sex of your baby. If you know your baby is a boy, it may be time to think about circumcision. Circumcision is the removal of the foreskin of the penis. This is the skin that covers the sensitive end of the penis. There is no proven medical need for this. Often this decision is made on what is popular at the time or based upon religious beliefs and social issues. You can discuss these issues with your caregiver or pediatrician.  SEEK IMMEDIATE MEDICAL CARE IF:   · An unexplained oral temperature above 102° F (38.9° C) develops, or as your caregiver suggests.  · You have leaking of fluid from the vagina (birth canal). If leaking membranes are suspected, take your temperature and tell your caregiver of this when you call.  · There is vaginal spotting, bleeding or passing clots. Tell your caregiver of the amount and how many pads are used.  · You develop a bad smelling vaginal discharge with  a change in the color from clear to white.  · You develop vomiting that lasts more than 24 hours.  · You develop chills or fever.  · You develop shortness of breath.  · You develop burning on urination.  · You loose more than 2 pounds of weight   or gain more than 2 pounds of weight or as suggested by your caregiver.  · You notice sudden swelling of your face, hands, and feet or legs.  · You develop belly (abdominal) pain. Round ligament discomfort is a common non-cancerous (benign) cause of abdominal pain in pregnancy. Your caregiver still must evaluate you.  · You develop a severe headache that does not go away.  · You develop visual problems, blurred or double vision.  · If you have not felt your baby move for more than 1 hour. If you think the baby is not moving as much as usual, eat something with sugar in it and lie down on your left side for an hour. The baby should move at least 4 to 5 times per hour. Call right away if your baby moves less than that.  · You fall, are in a car accident or any kind of trauma.  · There is mental or physical violence at home.  Document Released: 05/21/2001 Document Revised: 08/19/2011 Document Reviewed: 11/23/2008  Exit[employer]® Patient Information ©2013 [employer].

## 2012-06-08 ENCOUNTER — Other Ambulatory Visit: Payer: Self-pay | Admitting: *Deleted

## 2012-06-08 DIAGNOSIS — J45909 Unspecified asthma, uncomplicated: Secondary | ICD-10-CM

## 2012-06-08 MED ORDER — ALBUTEROL SULFATE HFA 108 (90 BASE) MCG/ACT IN AERS: 2.0000 | INHALATION_SPRAY | Freq: Four times a day (QID) | RESPIRATORY_TRACT | Status: DC | PRN

## 2012-06-08 NOTE — Telephone Encounter (Signed)
Pt has a cold and congestion and requesting RF on Albuterol inhaler.  Ok's and sent to [facility] pharmacy.

## 2012-06-10 NOTE — L&D Delivery Note (Signed)
Samantha Ortega is a 32 y.o. G2P1 presenting at [redacted]w[redacted]d in active labor. She progressed precipitously to complete dilation after AROM and had a spontaneous vaginal water birth.  Delivery Note At 7:57 PM a viable female was delivered via Vaginal, Spontaneous Delivery (Presentation: Right Occiput Anterior).  APGAR: 8, 9; weight pending.   Placenta status: Intact, Spontaneous.  Cord: 3 vessels with the following complications: None.  Cord pH: n/a  Anesthesia: None  Episiotomy: None Lacerations: 1st degree Suture Repair: Est. Blood Loss (mL): 350  Mom to postpartum.  Baby to nursery-stable.  Napoleon Form 07/21/2012, 8:37 PM

## 2012-06-10 NOTE — L&D Delivery Note (Signed)
I took over the care of the patient prior to the delivery of the placenta.  It was spont and intact. FF with massage. Sm lochia. Inspection revealed 1st degree lac- hemostatic and not requiring repair. One tube of cord blood able to be collected- not enough blood for second.  Cam Hai 07/21/2012 11:58 PM.

## 2012-06-15 ENCOUNTER — Ambulatory Visit (INDEPENDENT_AMBULATORY_CARE_PROVIDER_SITE_OTHER): Payer: BC Managed Care – PPO | Admitting: Advanced Practice Midwife

## 2012-06-15 VITALS — BP 104/61 | Wt 172.0 lb

## 2012-06-15 DIAGNOSIS — Z3480 Encounter for supervision of other normal pregnancy, unspecified trimester: Secondary | ICD-10-CM

## 2012-06-15 DIAGNOSIS — Z3490 Encounter for supervision of normal pregnancy, unspecified, unspecified trimester: Secondary | ICD-10-CM

## 2012-06-15 NOTE — Progress Notes (Signed)
p-93 

## 2012-06-15 NOTE — Progress Notes (Signed)
Doing well.  Good fetal movement, denies vaginal bleeding, LOF, regular contractions.  Having pevic girdle pain, especially when repositioning at night.  Warm showers at bedtime and in am helping.  Sleeping Ok despite discomfort.

## 2012-06-29 ENCOUNTER — Ambulatory Visit (INDEPENDENT_AMBULATORY_CARE_PROVIDER_SITE_OTHER): Payer: BC Managed Care – PPO | Admitting: Advanced Practice Midwife

## 2012-06-29 VITALS — BP 99/65 | Wt 177.0 lb

## 2012-06-29 DIAGNOSIS — Z3480 Encounter for supervision of other normal pregnancy, unspecified trimester: Secondary | ICD-10-CM

## 2012-06-29 NOTE — Addendum Note (Signed)
Addended by: Arne Cleveland on: 06/29/2012 09:11 AM   Modules accepted: Orders

## 2012-06-29 NOTE — Progress Notes (Signed)
P=88 

## 2012-06-29 NOTE — Progress Notes (Signed)
Doing well.  Good fetal movement, denies vaginal bleeding, LOF, regular contractions. Some contractions on Friday that stopped. Pelvic girdle pain continues, but unchanged from previous visits. GBS collected today.

## 2012-06-30 LAB — GC/CHLAMYDIA PROBE AMP
CT Probe RNA: NEGATIVE
GC Probe RNA: NEGATIVE

## 2012-06-30 LAB — OB RESULTS CONSOLE GBS: GBS: NEGATIVE

## 2012-07-07 ENCOUNTER — Encounter: Payer: Self-pay | Admitting: *Deleted

## 2012-07-07 ENCOUNTER — Ambulatory Visit (INDEPENDENT_AMBULATORY_CARE_PROVIDER_SITE_OTHER): Payer: BC Managed Care – PPO | Admitting: Obstetrics & Gynecology

## 2012-07-07 ENCOUNTER — Encounter: Payer: Self-pay | Admitting: Obstetrics & Gynecology

## 2012-07-07 VITALS — BP 110/66 | Wt 180.0 lb

## 2012-07-07 DIAGNOSIS — Z3480 Encounter for supervision of other normal pregnancy, unspecified trimester: Secondary | ICD-10-CM

## 2012-07-07 DIAGNOSIS — Z3490 Encounter for supervision of normal pregnancy, unspecified, unspecified trimester: Secondary | ICD-10-CM

## 2012-07-07 NOTE — Progress Notes (Signed)
Routine visit. No OB problems. Labor precautions reviewed. She declines a cervical exam.

## 2012-07-07 NOTE — Progress Notes (Signed)
P-69 - Pt states bilateral wrist pain

## 2012-07-13 ENCOUNTER — Ambulatory Visit (INDEPENDENT_AMBULATORY_CARE_PROVIDER_SITE_OTHER): Payer: BC Managed Care – PPO | Admitting: Advanced Practice Midwife

## 2012-07-13 VITALS — BP 103/68 | Wt 180.0 lb

## 2012-07-13 DIAGNOSIS — Z3480 Encounter for supervision of other normal pregnancy, unspecified trimester: Secondary | ICD-10-CM

## 2012-07-13 DIAGNOSIS — Z3490 Encounter for supervision of normal pregnancy, unspecified, unspecified trimester: Secondary | ICD-10-CM

## 2012-07-13 NOTE — Progress Notes (Signed)
Well, ready for labor. Rev'd precautions.

## 2012-07-13 NOTE — Progress Notes (Signed)
p=67 

## 2012-07-20 ENCOUNTER — Ambulatory Visit (INDEPENDENT_AMBULATORY_CARE_PROVIDER_SITE_OTHER): Payer: BC Managed Care – PPO | Admitting: Advanced Practice Midwife

## 2012-07-20 ENCOUNTER — Inpatient Hospital Stay (HOSPITAL_COMMUNITY)
Admission: AD | Admit: 2012-07-20 | Discharge: 2012-07-20 | Disposition: A | Discharge status: Home / Self Care | Payer: BC Managed Care – PPO | Source: Ambulatory Visit | Attending: Obstetrics & Gynecology | Admitting: Obstetrics & Gynecology

## 2012-07-20 ENCOUNTER — Encounter (HOSPITAL_COMMUNITY): Payer: Self-pay | Admitting: *Deleted

## 2012-07-20 VITALS — BP 110/60

## 2012-07-20 DIAGNOSIS — Z3490 Encounter for supervision of normal pregnancy, unspecified, unspecified trimester: Secondary | ICD-10-CM

## 2012-07-20 DIAGNOSIS — O26899 Other specified pregnancy related conditions, unspecified trimester: Secondary | ICD-10-CM

## 2012-07-20 DIAGNOSIS — Z3480 Encounter for supervision of other normal pregnancy, unspecified trimester: Secondary | ICD-10-CM

## 2012-07-20 DIAGNOSIS — O479 False labor, unspecified: Secondary | ICD-10-CM | POA: Insufficient documentation

## 2012-07-20 DIAGNOSIS — D369 Benign neoplasm, unspecified site: Secondary | ICD-10-CM

## 2012-07-20 NOTE — Progress Notes (Signed)
p-88  Pt would like membranes swept today

## 2012-07-20 NOTE — MAU Note (Signed)
Patient states she was in the office this am and was 4 cm. States contractions are 3 1/2 minutes. Denies any leaking or bleeding and had mucus plug pass this am. Reports good fetal movement.

## 2012-07-20 NOTE — Patient Instructions (Signed)
Pregnancy - Third Trimester The third trimester begins at the 28th week of pregnancy and ends at birth. It is important to follow your doctor's instructions. HOME CARE   Go to your doctor's visits.  Do not smoke.  Do not drink alcohol or use drugs.  Only take medicine as told by your doctor.  Take prenatal vitamins as told. The vitamin should contain 1 milligram of folic acid.  Exercise.  Eat healthy foods. Eat regular, well-balanced meals.  You can have sex (intercourse) if there are no other problems with the pregnancy.  Do not use hot tubs, steam rooms, or saunas.  Wear a seat belt while driving.  Avoid raw meat, uncooked cheese, and litter boxes and soil used by cats.  Rest with your legs raised (elevated).  Make a list of emergency phone numbers. Keep this list with you.  Arrange for help when you come back home after delivering the baby.  Make a trial run to the hospital.  Take prenatal classes.  Prepare the baby's nursery.  Do not travel out of the city. If you absolutely have to, get permission from your doctor first.  Wear flat shoes. Do not wear high heels. GET HELP RIGHT AWAY IF:   You have a temperature by mouth above 102 F (38.9 C), not controlled by medicine.  You have not felt the baby move for more than 1 hour. If you think the baby is not moving as much as normal, eat something with sugar in it or lie down on your left side for an hour. The baby should move at least 4 to 5 times per hour.  Fluid is coming from the vagina.  Blood is coming from the vagina. Light spotting is common, especially after sex (intercourse).  You have belly (abdominal) pain.  You have a bad smelling fluid (discharge) coming from the vagina. The fluid changes from clear to white.  You still feel sick to your stomach (nauseous).  You throw up (vomit) for more than 24 hours.  You have the chills.  You have shortness of breath.  You have a burning feeling when you  pee (urinate).  You lose or gain more than 2 pounds (0.9 kilograms) of weight over a week, or as told by your doctor.  Your face, hands, feet, or legs get puffy (swell).  You have a bad headache that will not go away.  You start to have problems seeing (blurry or double vision).  You fall, are in a car accident, or have any kind of trauma.  There is mental or physical violence at home.  You have any concerns or worries during your pregnancy. MAKE SURE YOU:   Understand these instructions.  Will watch your condition.  Will get help right away if you are not doing well or get worse. Document Released: 08/21/2009 Document Revised: 08/19/2011 Document Reviewed: 08/21/2009 [employer] Patient Information 2013 [employer].  

## 2012-07-20 NOTE — Progress Notes (Signed)
Ready for labor, membranes swept. Rev'd precautions. RTC Thursday for NST if undelivered.

## 2012-07-21 ENCOUNTER — Encounter (HOSPITAL_COMMUNITY): Payer: Self-pay

## 2012-07-21 ENCOUNTER — Inpatient Hospital Stay (HOSPITAL_COMMUNITY)
Admission: AD | Admit: 2012-07-21 | Discharge: 2012-07-23 | DRG: 373 | Disposition: A | Discharge status: Home / Self Care | Payer: BC Managed Care – PPO | Source: Ambulatory Visit | Attending: Obstetrics & Gynecology | Admitting: Obstetrics & Gynecology

## 2012-07-21 DIAGNOSIS — IMO0001 Reserved for inherently not codable concepts without codable children: Secondary | ICD-10-CM

## 2012-07-21 DIAGNOSIS — O479 False labor, unspecified: Secondary | ICD-10-CM | POA: Diagnosis present

## 2012-07-21 LAB — CBC
HCT: 33.1 % — ABNORMAL LOW (ref 36.0–46.0)
Hemoglobin: 10.7 g/dL — ABNORMAL LOW (ref 12.0–15.0)
MCV: 87.8 fL (ref 78.0–100.0)
RBC: 3.77 MIL/uL — ABNORMAL LOW (ref 3.87–5.11)
WBC: 11.8 10*3/uL — ABNORMAL HIGH (ref 4.0–10.5)

## 2012-07-21 LAB — TYPE AND SCREEN

## 2012-07-21 LAB — ABO/RH: ABO/RH(D): A POS

## 2012-07-21 MED ORDER — BISACODYL 10 MG RE SUPP: 10.0000 mg | Freq: Every day | RECTAL | Status: DC | PRN

## 2012-07-21 MED ORDER — ONDANSETRON HCL 4 MG/2ML IJ SOLN: 4.0000 mg | INTRAMUSCULAR | Status: DC | PRN

## 2012-07-21 MED ORDER — OXYTOCIN BOLUS FROM INFUSION: 500.0000 mL | INTRAVENOUS | Status: DC

## 2012-07-21 MED ORDER — LANOLIN HYDROUS EX OINT: TOPICAL_OINTMENT | CUTANEOUS | Status: DC | PRN

## 2012-07-21 MED ORDER — ONDANSETRON HCL 4 MG/2ML IJ SOLN: 4.0000 mg | Freq: Four times a day (QID) | INTRAMUSCULAR | Status: DC | PRN

## 2012-07-21 MED ORDER — WITCH HAZEL-GLYCERIN EX PADS: 1.0000 "application " | MEDICATED_PAD | CUTANEOUS | Status: DC | PRN

## 2012-07-21 MED ORDER — ACETAMINOPHEN 325 MG PO TABS: 650.0000 mg | ORAL_TABLET | ORAL | Status: DC | PRN

## 2012-07-21 MED ORDER — OXYCODONE-ACETAMINOPHEN 5-325 MG PO TABS: 1.0000 | ORAL_TABLET | ORAL | Status: DC | PRN

## 2012-07-21 MED ORDER — SENNOSIDES-DOCUSATE SODIUM 8.6-50 MG PO TABS
2.0000 | ORAL_TABLET | Freq: Every day | ORAL | Status: DC
  Administered 2012-07-22: 2 via ORAL

## 2012-07-21 MED ORDER — FERROUS SULFATE 325 (65 FE) MG PO TABS
325.0000 mg | ORAL_TABLET | Freq: Two times a day (BID) | ORAL | Status: DC
  Administered 2012-07-22: 325 mg via ORAL
  Filled 2012-07-21 (×2): qty 1

## 2012-07-21 MED ORDER — BENZOCAINE-MENTHOL 20-0.5 % EX AERO: 1.0000 "application " | INHALATION_SPRAY | CUTANEOUS | Status: DC | PRN

## 2012-07-21 MED ORDER — TETANUS-DIPHTH-ACELL PERTUSSIS 5-2.5-18.5 LF-MCG/0.5 IM SUSP: 0.5000 mL | Freq: Once | INTRAMUSCULAR | Status: DC

## 2012-07-21 MED ORDER — FLEET ENEMA 7-19 GM/118ML RE ENEM: 1.0000 | ENEMA | RECTAL | Status: DC | PRN

## 2012-07-21 MED ORDER — OXYCODONE-ACETAMINOPHEN 5-325 MG PO TABS
1.0000 | ORAL_TABLET | ORAL | Status: DC | PRN
  Administered 2012-07-22 (×2): 1 via ORAL
  Filled 2012-07-21: qty 2
  Filled 2012-07-21: qty 1

## 2012-07-21 MED ORDER — SIMETHICONE 80 MG PO CHEW
80.0000 mg | CHEWABLE_TABLET | ORAL | Status: DC | PRN
  Administered 2012-07-22 (×3): 80 mg via ORAL

## 2012-07-21 MED ORDER — ACETAMINOPHEN 500 MG PO TABS: 1000.0000 mg | ORAL_TABLET | Freq: Four times a day (QID) | ORAL | Status: DC | PRN

## 2012-07-21 MED ORDER — LIDOCAINE HCL (PF) 1 % IJ SOLN
30.0000 mL | INTRAMUSCULAR | Status: DC | PRN
  Filled 2012-07-21: qty 30

## 2012-07-21 MED ORDER — NALBUPHINE SYRINGE 5 MG/0.5 ML: 10.0000 mg | INJECTION | INTRAMUSCULAR | Status: DC | PRN

## 2012-07-21 MED ORDER — LACTATED RINGERS IV SOLN: INTRAVENOUS | Status: DC

## 2012-07-21 MED ORDER — IBUPROFEN 600 MG PO TABS
600.0000 mg | ORAL_TABLET | Freq: Four times a day (QID) | ORAL | Status: DC | PRN
  Administered 2012-07-21: 600 mg via ORAL
  Filled 2012-07-21: qty 1

## 2012-07-21 MED ORDER — OXYTOCIN 10 UNIT/ML IJ SOLN
INTRAMUSCULAR | Status: AC
  Filled 2012-07-21: qty 2

## 2012-07-21 MED ORDER — LACTATED RINGERS IV SOLN: 500.0000 mL | INTRAVENOUS | Status: DC | PRN

## 2012-07-21 MED ORDER — PRENATAL MULTIVITAMIN CH
1.0000 | ORAL_TABLET | Freq: Every day | ORAL | Status: DC
  Administered 2012-07-22 – 2012-07-23 (×2): 1 via ORAL
  Filled 2012-07-21 (×2): qty 1

## 2012-07-21 MED ORDER — CITRIC ACID-SODIUM CITRATE 334-500 MG/5ML PO SOLN: 30.0000 mL | ORAL | Status: DC | PRN

## 2012-07-21 MED ORDER — ALBUTEROL SULFATE HFA 108 (90 BASE) MCG/ACT IN AERS
2.0000 | INHALATION_SPRAY | RESPIRATORY_TRACT | Status: DC | PRN
  Filled 2012-07-21: qty 6.7

## 2012-07-21 MED ORDER — ONDANSETRON HCL 4 MG PO TABS: 4.0000 mg | ORAL_TABLET | ORAL | Status: DC | PRN

## 2012-07-21 MED ORDER — DIBUCAINE 1 % RE OINT: 1.0000 "application " | TOPICAL_OINTMENT | RECTAL | Status: DC | PRN

## 2012-07-21 MED ORDER — DIPHENHYDRAMINE HCL 25 MG PO CAPS: 25.0000 mg | ORAL_CAPSULE | Freq: Four times a day (QID) | ORAL | Status: DC | PRN

## 2012-07-21 MED ORDER — IBUPROFEN 600 MG PO TABS
600.0000 mg | ORAL_TABLET | Freq: Four times a day (QID) | ORAL | Status: DC
  Administered 2012-07-22 – 2012-07-23 (×6): 600 mg via ORAL
  Filled 2012-07-21 (×6): qty 1

## 2012-07-21 MED ORDER — OXYTOCIN 40 UNITS IN LACTATED RINGERS INFUSION - SIMPLE MED: 62.5000 mL/h | INTRAVENOUS | Status: DC

## 2012-07-21 MED ORDER — ZOLPIDEM TARTRATE 5 MG PO TABS: 5.0000 mg | ORAL_TABLET | Freq: Every evening | ORAL | Status: DC | PRN

## 2012-07-21 MED ORDER — MEASLES, MUMPS & RUBELLA VAC ~~LOC~~ INJ
0.5000 mL | INJECTION | Freq: Once | SUBCUTANEOUS | Status: DC
  Filled 2012-07-21: qty 0.5

## 2012-07-21 MED ORDER — FLEET ENEMA 7-19 GM/118ML RE ENEM: 1.0000 | ENEMA | Freq: Every day | RECTAL | Status: DC | PRN

## 2012-07-21 NOTE — H&P (Signed)
Attestation of Attending Supervision of Obstetric Fellow: Evaluation and management procedures were performed by the Obstetric Fellow under my supervision and collaboration.  I have reviewed the Obstetric Fellow's note and chart, and I agree with the management and plan.  Alexio Sroka, MD, FACOG Attending Obstetrician & Gynecologist Faculty Practice, [facility]   

## 2012-07-21 NOTE — Progress Notes (Signed)
Patient ID: Samantha Ortega, female   DOB: [birth year], 32 y.o.   MRN: [id number]  S:  Pt uncomfortable with contractions, in and out of tub, breathing through. Wants to have membranes artificially ruptured.  O:   Filed Vitals:   07/21/12 1827  BP: 111/60  Pulse: 86  Temp: 98.5 F (36.9 C)  Resp: 16    Dilation: 5.5 Effacement (%): 100 Cervical Position: Anterior Station: -2 Presentation: Vertex Exam by:: lee  FHTs:  Intermittent doppler: 140s CTX:  q 2-3 minutes  A/P 32 y.o. G2P1 at [redacted]w[redacted]d with SOL - AROM, clear - Continue plan for water birth, intermittent monitoring - Anticipate SVD  Napoleon Form, MD

## 2012-07-21 NOTE — MAU Note (Signed)
Pt to MAU for eval of labor, states planning water birth, does not want any medications for pain, pt standing up in room, only in bed for efm and cervical exam, then back to standing and rocking. Doula present along with spouse. Breathing through ctx's which are q 2-3 per pt.

## 2012-07-21 NOTE — H&P (Signed)
Samantha Ortega is a 32 y.o. female presenting for active labor.  Pt seen in office today, 4 cm. Having mild contractions for past few days but became stronger and more frequent about 2 hours ago. No loss of fluid. Bloody show. Baby moving well. No complications this pregnancy or last. Desires midwife water birth.  Maternal Medical History:  Reason for admission: Contractions.   Contractions: Onset was 1-2 hours ago.   Frequency: regular.   Perceived severity is strong.    Fetal activity: Perceived fetal activity is normal.   Last perceived fetal movement was within the past hour.    Prenatal complications: no prenatal complications Prenatal Complications - Diabetes: none.    OB History   Grav Para Term Preterm Abortions TAB SAB Ect Mult Living   2 1        1      Past Medical History  Diagnosis Date  . Abnormal cervical Pap smear with positive HPV DNA test 2011    colpo w/cx bx  . Anxiety   . ADD (attention deficit disorder)   . PCOS (polycystic ovarian syndrome)    Past Surgical History  Procedure Laterality Date  . No past surgeries     Family History: family history is negative for Alcohol abuse, and Arthritis, and Asthma, and Birth defects, and COPD, and Cancer, and Depression, and Diabetes, and Drug abuse, and Early death, and Hearing loss, and Heart disease, and Hyperlipidemia, and Hypertension, and Kidney disease, and Learning disabilities, and Mental illness, and Mental retardation, and Miscarriages / Stillbirths, and Stroke, and Vision loss, . Social History:  reports that she has never smoked. She has never used smokeless tobacco. She reports that she does not drink alcohol or use illicit drugs.   Prenatal Transfer Tool  Maternal Diabetes: No Genetic Screening: Declined Maternal Ultrasounds/Referrals: Abnormal:  Findings:   Other:possible fetal left dermoid Fetal Ultrasounds or other Referrals:  Other: possible fetal left dermoid Maternal Substance Abuse:   No Significant Maternal Medications:  None Significant Maternal Lab Results:  Lab values include: Group B Strep negative Other Comments:  None  Review of Systems  Constitutional: Negative for fever and chills.  Eyes: Negative for blurred vision and double vision.  Respiratory: Negative for shortness of breath.   Cardiovascular: Negative for chest pain and palpitations.  Gastrointestinal: Positive for abdominal pain.  Genitourinary: Negative for dysuria.  Neurological: Negative for dizziness and headaches.    Dilation: 5.5 Effacement (%): 90 Station: -2 Exam by:: Dr. Thad Ranger Last menstrual period 10/14/2011. Maternal Exam:  Uterine Assessment: Contraction strength is firm.  Contraction frequency is regular.   Abdomen: Patient reports generalized tenderness.  Fetal presentation: vertex  Introitus: Normal vulva. Normal vagina.  Vagina is negative for discharge.  Pelvis: adequate for delivery.   Cervix: Cervix evaluated by digital exam.     Fetal Exam Fetal Monitor Review: Mode: ultrasound.   Baseline rate: 140s.  Variability: moderate (6-25 bpm).   Insufficient monitoring to see accels or decels     Physical Exam  Constitutional: She is oriented to person, place, and time. She appears well-developed and well-nourished. She appears distressed (in pain during contractions).  HENT:  Head: Normocephalic and atraumatic.  Eyes: Conjunctivae and EOM are normal.  Neck: Normal range of motion. Neck supple.  Cardiovascular: Normal rate and regular rhythm.   Respiratory: Effort normal. No respiratory distress.  GI: Soft. There is generalized tenderness.  Genitourinary: Vagina normal. No vaginal discharge found.  Cervix 5-6/90/-2  Musculoskeletal: Normal range of motion.  She exhibits no edema and no tenderness.  Neurological: She is alert and oriented to person, place, and time.  Skin: Skin is warm and dry.  Psychiatric: She has a normal mood and affect.    Prenatal labs: ABO,  Rh: A/Positive/-- (07/15 0000) Antibody: Negative (07/15 0000) Rubella: Immune (07/15 0000) RPR: NON REAC (11/08 0844)  HBsAg: Negative (07/15 0000)  HIV: NON REACTIVE (11/08 0844)  GBS:     Assessment/Plan: 32 y.o. G2P1 at [redacted]w[redacted]d with spontaneous onset of labor - Admit to L&D, desires water birth, doula present - Intermittent monitoring - GBS negative - Anticipate SVD soon  Napoleon Form 07/21/2012, 6:09 PM

## 2012-07-22 LAB — RPR: RPR Ser Ql: NONREACTIVE

## 2012-07-22 MED ORDER — FERROUS SULFATE 325 (65 FE) MG PO TABS: 325.0000 mg | ORAL_TABLET | Freq: Two times a day (BID) | ORAL | Status: DC

## 2012-07-22 MED ORDER — IBUPROFEN 600 MG PO TABS: 600.0000 mg | ORAL_TABLET | Freq: Four times a day (QID) | ORAL | Status: DC

## 2012-07-22 NOTE — Discharge Summary (Signed)
Obstetric Discharge Summary Reason for Admission:  32 y.o. G2P1 at [redacted]w[redacted]d with spontaneous onset of labor Prenatal Procedures: none Intrapartum Procedures: spontaneous vaginal delivery, water birth, delivered precipitously after AROM Postpartum Procedures: none Complications-Operative and Postpartum: 1st degree perineal laceration, no repair needed ABL: 350cc Hemoglobin  Date Value Range Status  07/21/2012 10.7* 12.0 - 15.0 g/dL Final  1/61/0960 45.4   Final     HCT  Date Value Range Status  07/21/2012 33.1* 36.0 - 46.0 % Final  12/23/2011 35   Final    Physical Exam:  General: alert, cooperative and no distress Lochia: appropriate Uterine Fundus: firm DVT Evaluation: No evidence of DVT seen on physical exam. Negative Homan's sign.  Discharge Diagnoses: Term Pregnancy-delivered  Discharge Information: Date: 07/22/2012 Activity: pelvic rest Diet: routine Medications: Ibuprofen Condition: stable Instructions: refer to practice specific booklet Discharge to: home   Newborn Data: Live born female  Birth Weight: 7 lb 2.8 oz (3255 g) APGAR: 8, 9  Home with mother.  Samantha Ortega 07/22/2012, 8:58 AM

## 2012-07-22 NOTE — Progress Notes (Signed)
I have seen and examined this patient and I agree with the above. Cam Hai 8:54 AM 07/22/2012

## 2012-07-22 NOTE — Progress Notes (Signed)
Post Partum Day 1 Subjective: up ad lib, voiding, tolerating PO and would like to go home as soon as possible given weather and living in [facility]. Delivered at 7:30pm last night.   Objective: Blood pressure 101/56, pulse 75, temperature 98.7 F (37.1 C), temperature source Oral, resp. rate 18, height 5\' 4"  (1.626 m), weight 180 lb (81.647 kg), last menstrual period 10/14/2011, SpO2 97.00%, unknown if currently breastfeeding.  Physical Exam:  General: alert, cooperative and no distress Lochia: appropriate Uterine Fundus: firm DVT Evaluation: No evidence of DVT seen on physical exam.   Recent Labs  07/21/12 1832  HGB 10.7*  HCT 33.1*    Assessment/Plan: Breastfeeding Mirena for birth control Discharge planning: could d/c patient later tonight (for 24hrs) if baby cleared and weather conditions not terrible.   LOS: 1 day   Evalyn Shultis 07/22/2012, 8:07 AM

## 2012-07-23 ENCOUNTER — Encounter: Payer: BC Managed Care – PPO | Admitting: Obstetrics & Gynecology

## 2012-07-23 NOTE — Discharge Summary (Signed)
Obstetric Discharge Summary Reason for Admission:  32 y.o. G2P1 at [redacted]w[redacted]d with spontaneous onset of labor Prenatal Procedures: none Intrapartum Procedures: spontaneous vaginal delivery, water birth, delivered precipitously after AROM Postpartum Procedures: none Complications-Operative and Postpartum: 1st degree perineal laceration, no repair needed ABL: 350cc Hemoglobin  Date Value Range Status  07/21/2012 10.7* 12.0 - 15.0 g/dL Final  1/61/0960 45.4   Final     HCT  Date Value Range Status  07/21/2012 33.1* 36.0 - 46.0 % Final  12/23/2011 35   Final    Physical Exam:  General: alert, cooperative and no distress Lochia: appropriate Uterine Fundus: firm DVT Evaluation: No evidence of DVT seen on physical exam. Negative Homan's sign.  Discharge Diagnoses: Term Pregnancy-delivered  Discharge Information: Date: 07/23/2012 Activity: pelvic rest Diet: routine Medications: Ibuprofen Condition: stable Instructions: refer to practice specific booklet Discharge to: home   Newborn Data: Live born female  Birth Weight: 7 lb 2.8 oz (3255 g) APGAR: 8, 9  Home with mother.  Samantha First Hess, DO of [facility] 07/23/2012, 7:40 AM  I have seen and examined this patient and agree the above assessment. CRESENZO-DISHMAN,Samantha Ortega 07/23/2012 7:44 AM

## 2012-07-24 NOTE — Discharge Summary (Signed)
I have seen @PTNAME @ and examination done.  I agree with documentation and plan as noted in resident/PA's note. Atziry Baranski V 07/24/2012 8:43 AM

## 2012-07-25 ENCOUNTER — Other Ambulatory Visit: Payer: Self-pay

## 2012-08-01 NOTE — Discharge Summary (Signed)
Attestation of Attending Supervision of Advanced Practitioner: Evaluation and management procedures were performed by the PA/NP/CNM/OB Fellow under my supervision/collaboration. Chart reviewed and agree with management and plan.  Hue Frick V 08/01/2012 5:51 AM   

## 2012-08-31 ENCOUNTER — Encounter: Payer: Self-pay | Admitting: Obstetrics and Gynecology

## 2012-08-31 ENCOUNTER — Ambulatory Visit (INDEPENDENT_AMBULATORY_CARE_PROVIDER_SITE_OTHER): Payer: BC Managed Care – PPO | Admitting: Obstetrics and Gynecology

## 2012-08-31 NOTE — Patient Instructions (Signed)
Breastfeeding Deciding to breastfeed is one of the best choices you can make for you and your baby. The information that follows gives a brief overview of the benefits of breastfeeding as well as common topics surrounding breastfeeding. BENEFITS OF BREASTFEEDING For the baby  The first milk (colostrum) helps the baby's digestive system function better.   There are antibodies in the mother's milk that help the baby fight off infections.   The baby has a lower incidence of asthma, allergies, and sudden infant death syndrome (SIDS).   The nutrients in breast milk are better for the baby than infant formulas, and breast milk helps the baby's brain grow better.   Babies who breastfeed have less gas, colic, and constipation.  For the mother  Breastfeeding helps develop a very special bond between the mother and her baby.   Breastfeeding is convenient, always available at the correct temperature, and costs nothing.   Breastfeeding burns calories in the mother and helps her lose weight that was gained during pregnancy.   Breastfeeding makes the uterus contract back down to normal size faster and slows bleeding following delivery.   Breastfeeding mothers have a lower risk of developing breast cancer.  BREASTFEEDING FREQUENCY  A healthy, full-term baby may breastfeed as often as every hour or space his or her feedings to every 3 hours.   Watch your baby for signs of hunger. Nurse your baby if he or she shows signs of hunger. How often you nurse will vary from baby to baby.   Nurse as often as the baby requests, or when you feel the need to reduce the fullness of your breasts.   Awaken the baby if it has been 3 4 hours since the last feeding.   Frequent feeding will help the mother make more milk and will help prevent problems, such as sore nipples and engorgement of the breasts.  BABY'S POSITION AT THE BREAST  Whether lying down or sitting, be sure that the baby's tummy is  facing your tummy.   Support the breast with 4 fingers underneath the breast and the thumb above. Make sure your fingers are well away from the nipple and baby's mouth.   Stroke the baby's lips gently with your finger or nipple.   When the baby's mouth is open wide enough, place all of your nipple and as much of the areola as possible into your baby's mouth.   Pull the baby in close so the tip of the nose and the baby's cheeks touch the breast during the feeding.  FEEDINGS AND SUCTION  The length of each feeding varies from baby to baby and from feeding to feeding.   The baby must suck about 2 3 minutes for your milk to get to him or her. This is called a "let down." For this reason, allow the baby to feed on each breast as long as he or she wants. Your baby will end the feeding when he or she has received the right balance of nutrients.   To break the suction, put your finger into the corner of the baby's mouth and slide it between his or her gums before removing your breast from his or her mouth. This will help prevent sore nipples.  HOW TO TELL WHETHER YOUR BABY IS GETTING ENOUGH BREAST MILK. Wondering whether or not your baby is getting enough milk is a common concern among mothers. You can be assured that your baby is getting enough milk if:   Your baby is actively   sucking and you hear swallowing.   Your baby seems relaxed and satisfied after a feeding.   Your baby nurses at least 8 12 times in a 24 hour time period. Nurse your baby until he or she unlatches or falls asleep at the first breast (at least 10 20 minutes), then offer the second side.   Your baby is wetting 5 6 disposable diapers (6 8 cloth diapers) in a 24 hour period by 5 6 days of age.   Your baby is having at least 3 4 stools every 24 hours for the first 6 weeks. The stool should be soft and yellow.   Your baby should gain 4 7 ounces per week after he or she is 4 days old.   Your breasts feel softer  after nursing.  REDUCING BREAST ENGORGEMENT  In the first week after your baby is born, you may experience signs of breast engorgement. When breasts are engorged, they feel heavy, warm, full, and may be tender to the touch. You can reduce engorgement if you:   Nurse frequently, every 2 3 hours. Mothers who breastfeed early and often have fewer problems with engorgement.   Place light ice packs on your breasts for 10 20 minutes between feedings. This reduces swelling. Wrap the ice packs in a lightweight towel to protect your skin. Bags of frozen vegetables work well for this purpose.   Take a warm shower or apply warm, moist heat to your breast for 5 10 minutes just before each feeding. This increases circulation and helps the milk flow.   Gently massage your breast before and during the feeding. Using your finger tips, massage from the chest wall towards your nipple in a circular motion.   Make sure that the baby empties at least one breast at every feeding before switching sides.   Use a breast pump to empty the breasts if your baby is sleepy or not nursing well. You may also want to pump if you are returning to work oryou feel you are getting engorged.   Avoid bottle feeds, pacifiers, or supplemental feedings of water or juice in place of breastfeeding. Breast milk is all the food your baby needs. It is not necessary for your baby to have water or formula. In fact, to help your breasts make more milk, it is best not to give your baby supplemental feedings during the early weeks.   Be sure the baby is latched on and positioned properly while breastfeeding.   Wear a supportive bra, avoiding underwire styles.   Eat a balanced diet with enough fluids.   Rest often, relax, and take your prenatal vitamins to prevent fatigue, stress, and anemia.  If you follow these suggestions, your engorgement should improve in 24 48 hours. If you are still experiencing difficulty, call your  lactation consultant or caregiver.  CARING FOR YOURSELF Take care of your breasts  Bathe or shower daily.   Avoid using soap on your nipples.   Start feedings on your left breast at one feeding and on your right breast at the next feeding.   You will notice an increase in your milk supply 2 5 days after delivery. You may feel some discomfort from engorgement, which makes your breasts very firm and often tender. Engorgement "peaks" out within 24 48 hours. In the meantime, apply warm moist towels to your breasts for 5 10 minutes before feeding. Gentle massage and expression of some milk before feeding will soften your breasts, making it easier for your   baby to latch on.   Wear a well-fitting nursing bra, and air dry your nipples for a 3 4minutes after each feeding.   Only use cotton bra pads.   Only use pure lanolin on your nipples after nursing. You do not need to wash it off before feeding the baby again. Another option is to express a few drops of breast milk and gently massage it into your nipples.  Take care of yourself  Eat well-balanced meals and nutritious snacks.   Drinking milk, fruit juice, and water to satisfy your thirst (about 8 glasses a day).   Get plenty of rest.  Avoid foods that you notice affect the baby in a bad way.  SEEK MEDICAL CARE IF:   You have difficulty with breastfeeding and need help.   You have a hard, red, sore area on your breast that is accompanied by a fever.   Your baby is too sleepy to eat well or is having trouble sleeping.   Your baby is wetting less than 6 diapers a day, by 5 days of age.   Your baby's skin or white part of his or her eyes is more yellow than it was in the hospital.   You feel depressed.  Document Released: 05/27/2005 Document Revised: 11/26/2011 Document Reviewed: 08/25/2011 [employer] Patient Information 2013 [employer].  

## 2012-08-31 NOTE — Progress Notes (Signed)
  Subjective:     Samantha Ortega is a 32 y.o. female who presents for a postpartum visit. She is 6 weeks postpartum following a spontaneous vaginal delivery water birth. I have fully reviewed the prenatal and intrapartum course. The delivery was at 40 gestational weeks. Outcome: spontaneous vaginal delivery. Anesthesia: none. Postpartum course has been uncomplicated. Baby's course has been uncomplicated.Pecola Leisure is feeding by breast. Bleeding no bleeding. Bowel function is normal. Bladder function is normal. Patient is not sexually active. Contraception method is none. Postpartum depression screening: negative. Plans return to work in a few weeks and is pumping. Gluten/dairy free diet due to baby having gas.  The following portions of the patient's history were reviewed and updated as appropriate: allergies, current medications, past family history, past medical history, past social history, past surgical history and problem list.  Review of Systems Pertinent items are noted in HPI.   Objective:    BP 102/60  Pulse 69  Resp 16  Ht 5\' 4"  (1.626 m)  Wt 69.4 kg (153 lb)  BMI 26.25 kg/m2  Breastfeeding? Yes  General:  alert and no distress   Breasts:  inspection negative, no nipple discharge or bleeding, no masses or nodularity palpable and lactational  Lungs: clear to auscultation bilaterally  Heart:  regular rate and rhythm, S1, S2 normal, no murmur, click, rub or gallop  Abdomen: soft, non-tender; bowel sounds normal; no masses,  no organomegaly   Vulva:  not evaluated  Vagina: not evaluated  Cervix:  nabothian cyst and not examined  Corpus: not examined  Adnexa:  not evaluated  Rectal Exam: Not performed.       No thyromegaly Assessment:     6wks postpartum exam. Pap smear not done at today's visit.   Plan:    1. Contraception: condoms. Plans to start sexual activity and desires to become pregnant around 6 months postpartum. 2. Exercises reviewed 3. Follow up in: 3 months or as  needed.

## 2013-04-15 ENCOUNTER — Other Ambulatory Visit: Payer: Self-pay

## 2013-06-27 IMAGING — US US OB DETAIL+14 WK
2 series · 12 of 28 positions shown · non-contrast
Comparison: none

[Series 1: us ob detail +14 wk · 1 of 7 slices shown (1 of 2)]
[im 7/7]
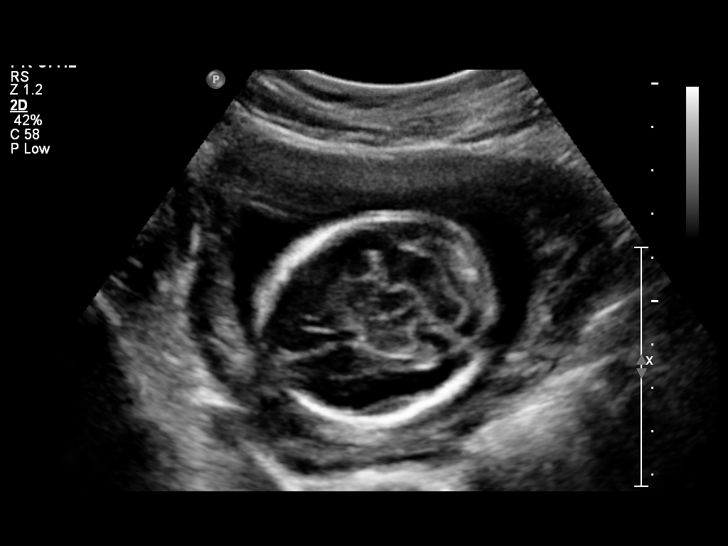

[Series 1: us ob detail +14 wk · 11 of 85 slices shown (2 of 2)]
[im 4/85]
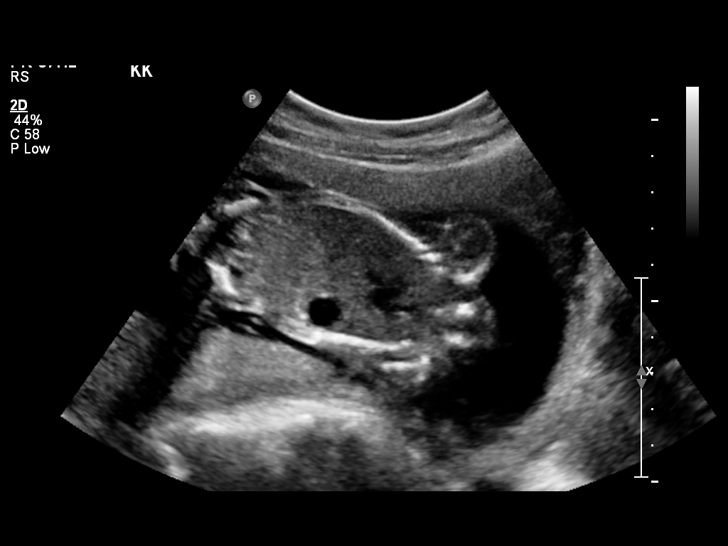
[im 11/85]
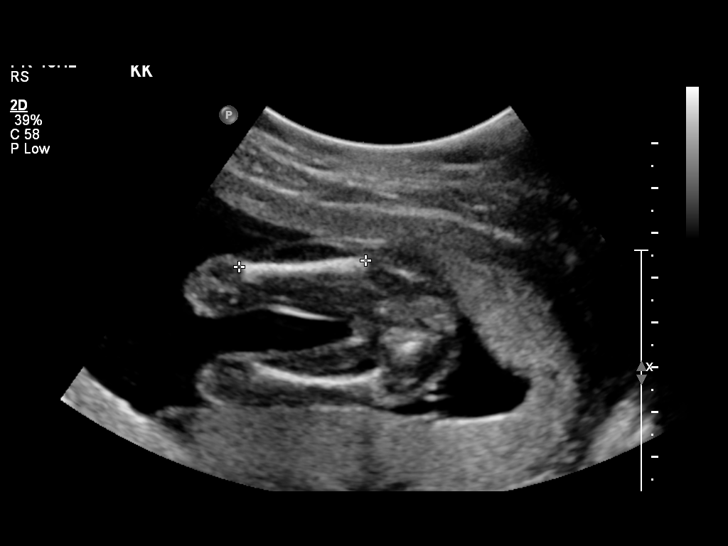
[im 21/85]
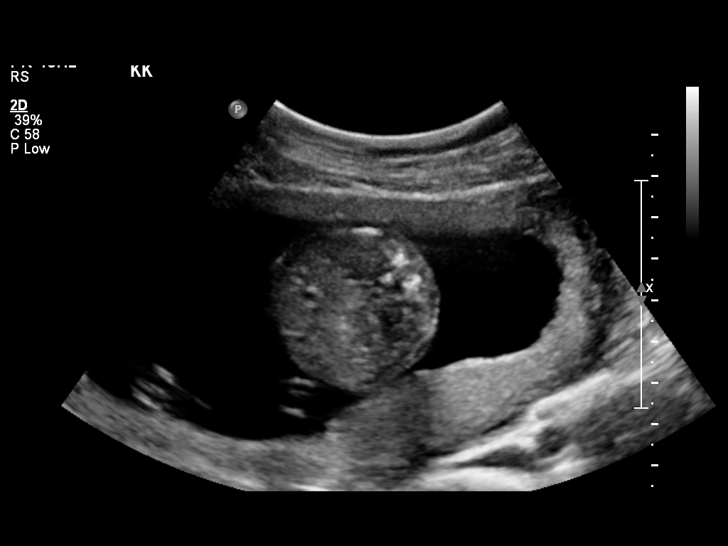
[im 27/85]
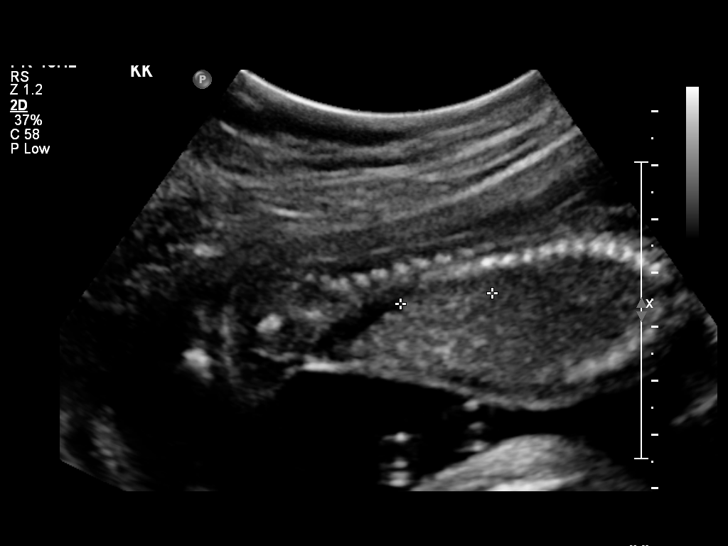
[im 34/85]
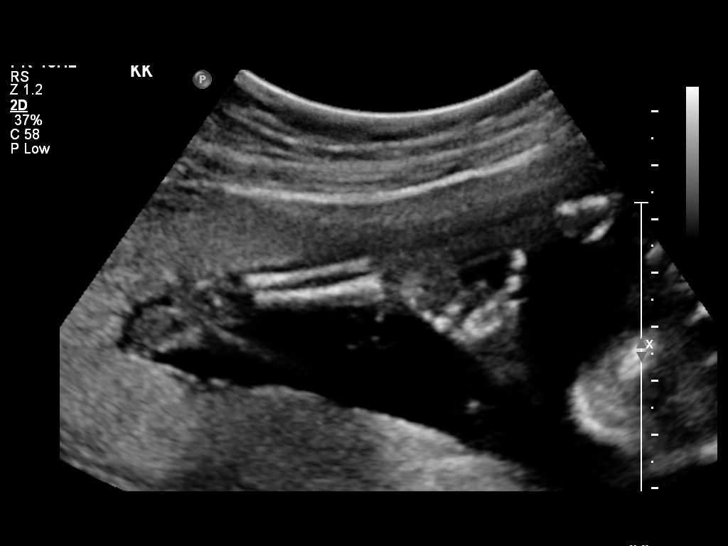
[im 44/85]
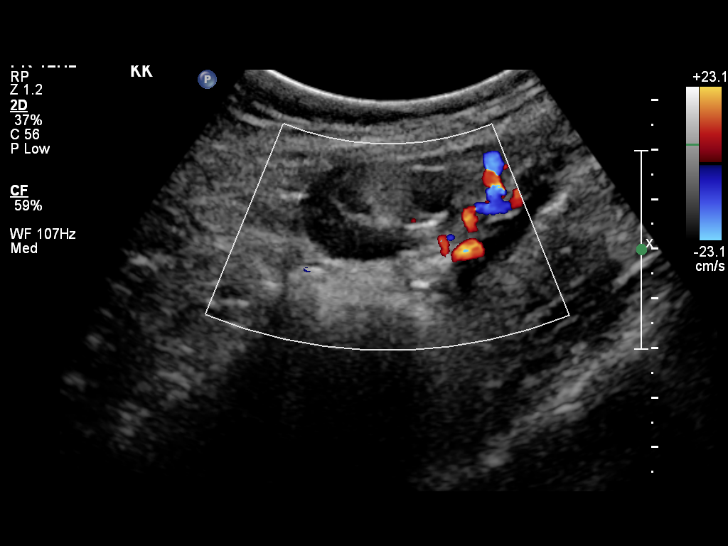
[im 51/85]
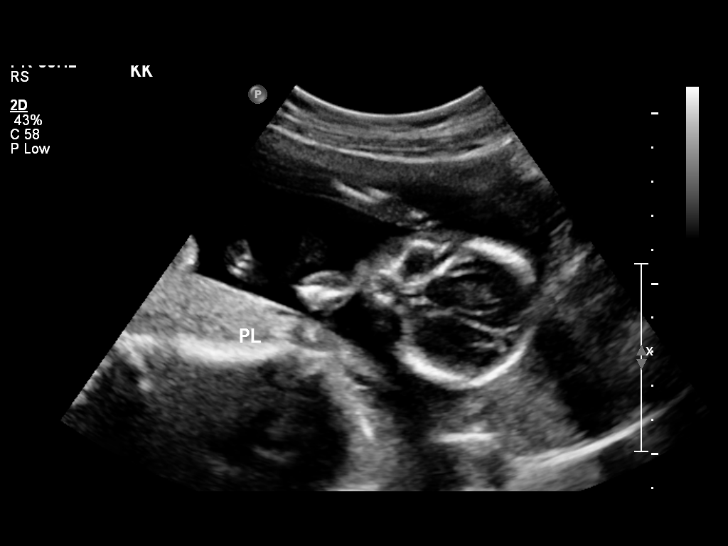
[im 58/85]
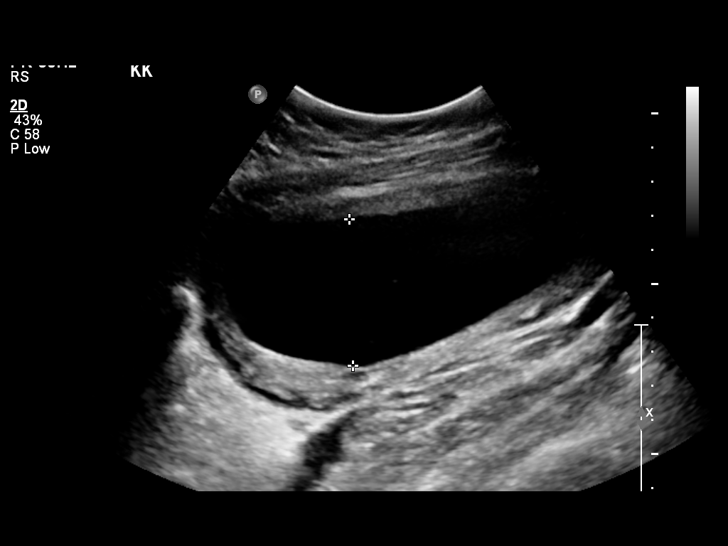
[im 68/85]
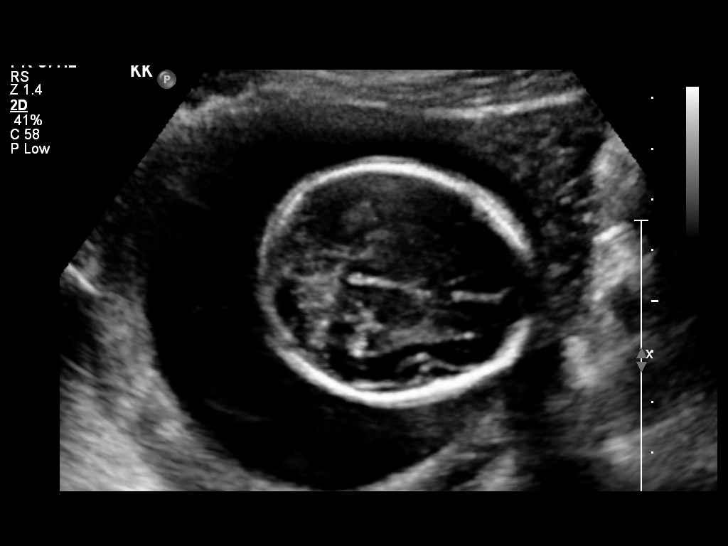
[im 74/85]
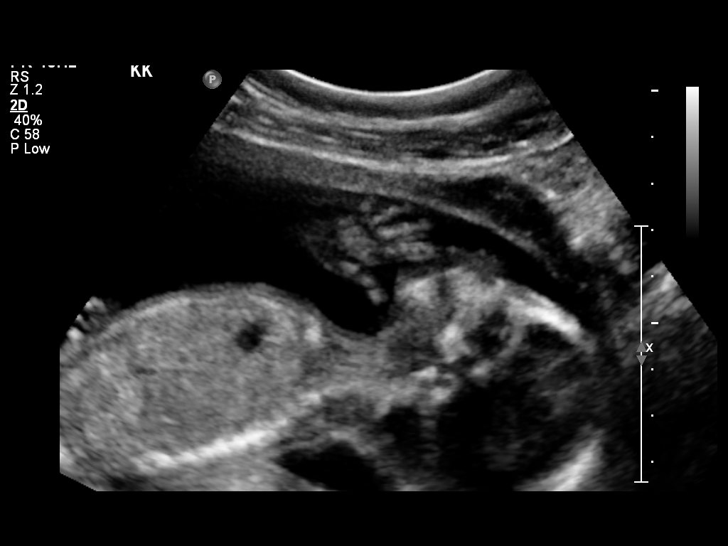
[im 81/85]
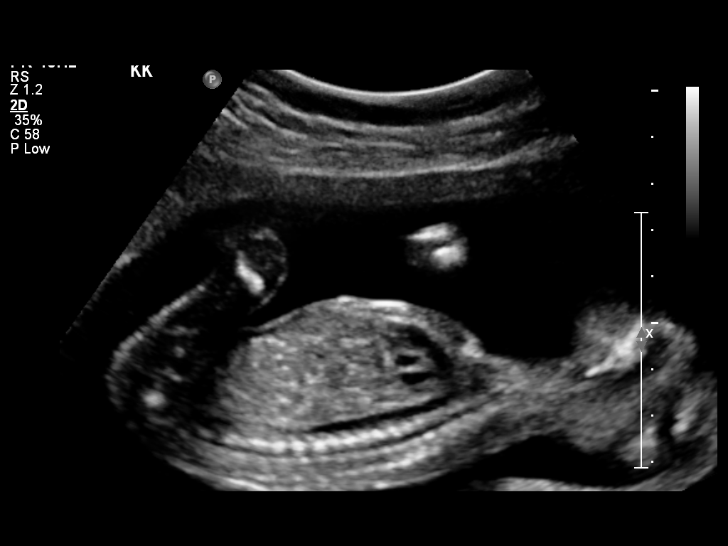

[12 of 28 positions shown; findings below may reference images not displayed]

OBSTETRICS REPORT
                      (Signed Final 02/24/2012 [DATE])

 Order#:         [id number]
Procedures

 US OB DETAIL + 14 WK                                  76811.0
Indications

 Detailed fetal anatomic survey
Fetal Evaluation

 Fetal Heart Rate:  142                         bpm
 Cardiac Activity:  Observed
 Presentation:      Cephalic
 Placenta:          Posterior Fundal, above
                    cervical os
 P. Cord            Visualized
 Insertion:

 Amniotic Fluid
 AFI FV:      Subjectively within normal limits
                                             Larg Pckt:     4.3  cm
Biometry

 BPD:     44.2  mm    G. Age:   19w 3d                CI:        73.51   70 - 86
                                                      FL/HC:      17.2   16.1 -

 HC:     163.8  mm    G. Age:   19w 1d       49  %    HC/AC:      1.18   1.09 -

 AC:     139.4  mm    G. Age:   19w 2d       57  %    FL/BPD:
 FL:      28.1  mm    G. Age:   18w 4d       30  %    FL/AC:      20.2   20 - 24
 NFT:      3.1  mm

 Est. FW:     270  gm    0 lb 10 oz      45  %
Gestational Age

 LMP:           19w 0d       Date:   10/14/11                 EDD:   07/20/12
 U/S Today:     19w 1d                                        EDD:   07/19/12
 Best:          19w 0d    Det. By:   LMP  (10/14/11)          EDD:   07/20/12
Anatomy

 Cranium:           Appears normal      Aortic Arch:       Appears normal
 Fetal Cavum:       Appears normal      Ductal Arch:       Appears normal
 Ventricles:        Appears normal      Diaphragm:         Appears normal
 Choroid Plexus:    Appears normal      Stomach:           Appears
                                                           normal, left
                                                           sided
 Cerebellum:        Appears normal      Abdomen:           Appears normal
 Posterior Fossa:   Appears normal      Abdominal Wall:    Appears nml
                                                           (cord insert,
                                                           abd wall)
 Nuchal Fold:       Appears normal      Cord Vessels:      Appears normal
                    (neck, nuchal                          (3 vessel cord)
                    fold)
 Face:              Appears normal      Kidneys:           Appear normal
                    (lips/profile/orbit
                    s)
 Heart:             Appears normal      Bladder:           Appears normal
                    (4 chamber &
                    axis)
 RVOT:              Appears normal      Spine:             Appears normal
 LVOT:              Appears normal      Limbs:             Four extremities
                                                           seen

 Other:     Parents do not wish to know sex of fetus. Heels and 5th
            digit visualized. Nasal bone visualized. Technically
            difficult due to fetal position.
Cervix Uterus Adnexa

 Cervical Length:   3.3       cm

 Cervix:       Closed. Normal appearance by transabdominal scan.
 Left Ovary:   Predominantly hyperechoic mass measuring 1.6 x1.4
               x 1.7cm, most c/w dermoid.
 Right Ovary:  Within normal limits.

 Adnexa:     No abnormality visualized.
Impression

 Single living IUP.  US EGA is concordant with LMP.
 No fetal anomalies seen involving visualized anatomy.
 No sonographic markers for aneuploidy visualized.
 1.7cm hyperechoic mass in left ovary, consistent with
 dermoid.  If clinically warranted, pelvic MRI could be obtained
 for definitive characterization/diagnosis.

 questions or concerns.

## 2013-12-20 ENCOUNTER — Encounter: Payer: Self-pay | Admitting: Advanced Practice Midwife

## 2013-12-20 ENCOUNTER — Ambulatory Visit (INDEPENDENT_AMBULATORY_CARE_PROVIDER_SITE_OTHER): Payer: BC Managed Care – PPO | Admitting: Advanced Practice Midwife

## 2013-12-20 VITALS — BP 95/57 | HR 82 | Wt 156.0 lb

## 2013-12-20 DIAGNOSIS — Z3481 Encounter for supervision of other normal pregnancy, first trimester: Secondary | ICD-10-CM

## 2013-12-20 DIAGNOSIS — K429 Umbilical hernia without obstruction or gangrene: Secondary | ICD-10-CM

## 2013-12-20 DIAGNOSIS — Z3480 Encounter for supervision of other normal pregnancy, unspecified trimester: Secondary | ICD-10-CM

## 2013-12-20 DIAGNOSIS — Z113 Encounter for screening for infections with a predominantly sexual mode of transmission: Secondary | ICD-10-CM

## 2013-12-20 DIAGNOSIS — Z1151 Encounter for screening for human papillomavirus (HPV): Secondary | ICD-10-CM

## 2013-12-20 DIAGNOSIS — Z124 Encounter for screening for malignant neoplasm of cervix: Secondary | ICD-10-CM

## 2013-12-20 NOTE — Progress Notes (Signed)
Subjective:    Ritu Gagliardo is a [id number] [redacted]w[redacted]d being seen today for her first obstetrical visit.  Her obstetrical history is significant for nothing.. Patient does intend to breast feed. Pregnancy history fully reviewed.   Patient reports C/O discomfort at site of umbilical hernia. Was very uncomfortable w/ last pregnancy. Already feeling pressure at site in this pregnancy. Denied firm, irreducible  mass. Has never discussed management w/ general surgeon.   Filed Vitals:   12/20/13 0827  BP: 95/57  Pulse: 82  Weight: 156 lb (70.761 kg)    HISTORY: OB History  Gravida Para Term Preterm AB SAB TAB Ectopic Multiple Living  3 2 2       2     # Outcome Date GA Lbr Len/2nd Weight Sex Delivery Anes PTL Lv  3 CUR           2 TRM 07/21/12 [redacted]w[redacted]d 02:14 7 lb 2.8 oz (3.255 kg) F SVD None  Y  1 TRM 2012   6 lb 7 oz (2.92 kg) F SVD None  Y     Comments: 3rd degree lac; rapid 2nd stage     Past Medical History  Diagnosis Date  . Abnormal cervical Pap smear with positive HPV DNA test 2011    colpo w/cx bx  . Anxiety   . ADD (attention deficit disorder)   . PCOS (polycystic ovarian syndrome)    Past Surgical History  Procedure Laterality Date  . No past surgeries     Family History  Problem Relation Age of Onset  . Alcohol abuse Neg Hx   . Arthritis Neg Hx   . Asthma Neg Hx   . Birth defects Neg Hx   . COPD Neg Hx   . Cancer Neg Hx   . Depression Neg Hx   . Diabetes Neg Hx   . Drug abuse Neg Hx   . Early death Neg Hx   . Hearing loss Neg Hx   . Heart disease Neg Hx   . Hyperlipidemia Neg Hx   . Hypertension Neg Hx   . Kidney disease Neg Hx   . Learning disabilities Neg Hx   . Mental illness Neg Hx   . Mental retardation Neg Hx   . Miscarriages / Stillbirths Neg Hx   . Stroke Neg Hx   . Vision loss Neg Hx      Exam    Uterus:  Fundal Height: 11 cm. Live SIUP w/ cardial activity and + FM per informal BS [place].  Pelvic Exam:    Perineum: No Hemorrhoids   Vulva: normal    Vagina:  normal mucosa, normal discharge   pH: NA   Cervix: multiparous appearance, no bleeding following Pap, no cervical motion tenderness and no lesions   Adnexa: no mass, fullness, tenderness   Bony Pelvis: average  System: Breast:  normal appearance, no masses or tenderness   Skin: normal coloration and turgor, no rashes    Neurologic: oriented, normal mood, grossly non-focal   Extremities: normal strength, tone, and muscle mass   HEENT sclera clear, anicteric and thyroid without masses   Mouth/Teeth mucous membranes moist, pharynx normal without lesions and dental hygiene good   Neck supple and no masses   Cardiovascular: regular rate and rhythm, no murmurs or gallops   Respiratory:  appears well, vitals normal, no respiratory distress, acyanotic, normal RR, chest clear, no wheezing, crepitations, rhonchi, normal symmetric air entry   Abdomen: soft, non-tender; bowel sounds normal; no masses,  no  organomegaly and Fundus non-palpable. 3-4 cm soft, reducible bulge at umbilicus w/ valsalva.    Urinary: urethral meatus normal      Assessment:    Pregnancy: [id number] Encounter for supervision of other normal pregnancy in first trimester - Plan: Cytology - PAP, CULTURE, URINE COMPREHENSIVE, Prenatal (OB Panel), HIV antibody (with reflex)  Recurrent umbilical hernia - Plan: Ambulatory referral to General Surgery        Plan:     Initial labs drawn. Prenatal vitamins. Problem list reviewed and updated. Genetic Screening discussed First Screen: declined for now. will call to order if desired.  Ultrasound discussed; fetal survey: requested.  Follow up in 4 weeks. 50% of 30 min visit spent on counseling and coordination of care.  General surgery referral and incarcerated her precautions.   Manya Silvas 12/20/2013

## 2013-12-20 NOTE — Patient Instructions (Signed)
Pregnancy - First Trimester During sexual intercourse, millions of sperm go into the vagina. Only 1 sperm will penetrate and fertilize the female egg while it is in the Fallopian tube. One week later, the fertilized egg implants into the wall of the uterus. An embryo begins to develop into a baby. At 6 to 8 weeks, the eyes and face are formed and the heartbeat can be seen on ultrasound. At the end of 12 weeks (first trimester), all the baby's organs are formed. Now that you are pregnant, you will want to do everything you can to have a healthy baby. Two of the most important things are to get good prenatal care and follow your caregiver's instructions. Prenatal care is all the medical care you receive before the baby's birth. It is given to prevent, find, and treat problems during the pregnancy and childbirth. PRENATAL EXAMS  During prenatal visits, your weight, blood pressure, and urine are checked. This is done to make sure you are healthy and progressing normally during the pregnancy.  A pregnant woman should gain 25 to 35 pounds during the pregnancy. However, if you are overweight or underweight, your caregiver will advise you regarding your weight.  Your caregiver will ask and answer questions for you.  Blood work, cervical cultures, other necessary tests, and a Pap test are done during your prenatal exams. These tests are done to check on your health and the probable health of your baby. Tests are strongly recommended and done for HIV with your permission. This is the virus that causes AIDS. These tests are done because medicines can be given to help prevent your baby from being born with this infection should you have been infected without knowing it. Blood work is also used to find out your blood type, previous infections, and follow your blood levels (hemoglobin).  Low hemoglobin (anemia) is common during pregnancy. Iron and vitamins are given to help prevent this. Later in the pregnancy,  blood tests for diabetes will be done along with any other tests if any problems develop.  You may need other tests to make sure you and the baby are doing well. CHANGES DURING THE FIRST TRIMESTER  Your body goes through many changes during pregnancy. They vary from person to person. Talk to your caregiver about changes you notice and are concerned about. Changes can include:  Your menstrual period stops.  The egg and sperm carry the genes that determine what you look like. Genes from you and your partner are forming a baby. The female genes determine whether the baby is a boy or a girl.  Your body increases in girth and you may feel bloated.  Feeling sick to your stomach (nauseous) and throwing up (vomiting). If the vomiting is uncontrollable, call your caregiver.  Your breasts will begin to enlarge and become tender.  Your nipples may stick out more and become darker.  The need to urinate more. Painful urination may mean you have a bladder infection.  Tiring easily.  Loss of appetite.  Cravings for certain kinds of food.  At first, you may gain or lose a couple of pounds.  You may have changes in your emotions from day to day (excited to be pregnant or concerned something may go wrong with the pregnancy and baby).  You may have more vivid and strange dreams. HOME CARE INSTRUCTIONS   It is very important to avoid all smoking, alcohol and non-prescribed drugs during your pregnancy. These affect the formation and growth of the baby.  Avoid chemicals while pregnant to ensure the delivery of a healthy infant.  Start your prenatal visits by the 12th week of pregnancy. They are usually scheduled monthly at first, then more often in the last 2 months before delivery. Keep your caregiver's appointments. Follow your caregiver's instructions regarding medicine use, blood and lab tests, exercise, and diet.  During pregnancy, you are providing food for you and your baby. Eat regular,  well-balanced meals. Choose foods such as meat, fish, milk and other low fat dairy products, vegetables, fruits, and whole-grain breads and cereals. Your caregiver will tell you of the ideal weight gain.  You can help morning sickness by keeping soda crackers at the bedside. Eat a couple before arising in the morning. You may want to use the crackers without salt on them.  Eating 4 to 5 small meals rather than 3 large meals a day also may help the nausea and vomiting.  Drinking liquids between meals instead of during meals also seems to help nausea and vomiting.  A physical sexual relationship may be continued throughout pregnancy if there are no other problems. Problems may be early (premature) leaking of amniotic fluid from the membranes, vaginal bleeding, or belly (abdominal) pain.  Exercise regularly if there are no restrictions. Check with your caregiver or physical therapist if you are unsure of the safety of some of your exercises. Greater weight gain will occur in the last 2 trimesters of pregnancy. Exercising will help:  Control your weight.  Keep you in shape.  Prepare you for labor and delivery.  Help you lose your pregnancy weight after you deliver your baby.  Wear a good support or jogging bra for breast tenderness during pregnancy. This may help if worn during sleep too.  Ask when prenatal classes are available. Begin classes when they are offered.  Do not use hot tubs, steam rooms, or saunas.  Wear your seat belt when driving. This protects you and your baby if you are in an accident.  Avoid raw meat, uncooked cheese, cat litter boxes, and soil used by cats throughout the pregnancy. These carry germs that can cause birth defects in the baby.  The first trimester is a good time to visit your dentist for your dental health. Getting your teeth cleaned is okay. Use a softer toothbrush and brush gently during pregnancy.  Ask for help if you have financial, counseling, or  nutritional needs during pregnancy. Your caregiver will be able to offer counseling for these needs as well as refer you for other special needs.  Do not take any medicines or herbs unless told by your caregiver.  Inform your caregiver if there is any mental or physical domestic violence.  Make a list of emergency phone numbers of family, friends, hospital, and police and fire departments.  Write down your questions. Take them to your prenatal visit.  Do not douche.  Do not cross your legs.  If you have to stand for long periods of time, rotate you feet or take small steps in a circle.  You may have more vaginal secretions that may require a sanitary pad. Do not use tampons or scented sanitary pads. MEDICINES AND DRUG USE IN PREGNANCY  Take prenatal vitamins as directed. The vitamin should contain 1 milligram of folic acid. Keep all vitamins out of reach of children. Only a couple vitamins or tablets containing iron may be fatal to a baby or young child when ingested.  Avoid use of all medicines, including herbs, over-the-counter medicines, not  prescribed or suggested by your caregiver. Only take over-the-counter or prescription medicines for pain, discomfort, or fever as directed by your caregiver. Do not use aspirin, ibuprofen, or naproxen unless directed by your caregiver.  Let your caregiver also know about herbs you may be using.  Alcohol is related to a number of birth defects. This includes fetal alcohol syndrome. All alcohol, in any form, should be avoided completely. Smoking will cause low birth rate and premature babies.  Street or illegal drugs are very harmful to the baby. They are absolutely forbidden. A baby born to an addicted mother will be addicted at birth. The baby will go through the same withdrawal an adult does.  Let your caregiver know about any medicines that you have to take and for what reason you take them. SEEK MEDICAL CARE IF:  You have any concerns or  worries during your pregnancy. It is better to call with your questions if you feel they cannot wait, rather than worry about them. SEEK IMMEDIATE MEDICAL CARE IF:   An unexplained oral temperature above 102 F (38.9 C) develops, or as your caregiver suggests.  You have leaking of fluid from the vagina (birth canal). If leaking membranes are suspected, take your temperature and inform your caregiver of this when you call.  There is vaginal spotting or bleeding. Notify your caregiver of the amount and how many pads are used.  You develop a bad smelling vaginal discharge with a change in the color.  You continue to feel sick to your stomach (nauseated) and have no relief from remedies suggested. You vomit blood or coffee ground-like materials.  You lose more than 2 pounds of weight in 1 week.  You gain more than 2 pounds of weight in 1 week and you notice swelling of your face, hands, feet, or legs.  You gain 5 pounds or more in 1 week (even if you do not have swelling of your hands, face, legs, or feet).  You get exposed to [place] measles and have never had them.  You are exposed to fifth disease or chickenpox.  You develop belly (abdominal) pain. Round ligament discomfort is a common non-cancerous (benign) cause of abdominal pain in pregnancy. Your caregiver still must evaluate this.  You develop headache, fever, diarrhea, pain with urination, or shortness of breath.  You fall or are in a car accident or have any kind of trauma.  There is mental or physical violence in your home. Document Released: 05/21/2001 Document Revised: 02/19/2012 Document Reviewed: 04/06/2013 [facility] Patient Information 2015 [place], [place]. This information is not intended to replace advice given to you by your health care provider. Make sure you discuss any questions you have with your health care provider.  Breastfeeding Deciding to breastfeed is one of the best choices you can make for you and your  baby. A change in hormones during pregnancy causes your breast tissue to grow and increases the number and size of your milk ducts. These hormones also allow proteins, sugars, and fats from your blood supply to make breast milk in your milk-producing glands. Hormones prevent breast milk from being released before your baby is born as well as prompt milk flow after birth. Once breastfeeding has begun, thoughts of your baby, as well as his or her sucking or crying, can stimulate the release of milk from your milk-producing glands.  BENEFITS OF BREASTFEEDING For Your Baby  Your first milk (colostrum) helps your baby's digestive system function better.   There are antibodies in  your milk that help your baby fight off infections.   Your baby has a lower incidence of asthma, allergies, and sudden infant death syndrome.   The nutrients in breast milk are better for your baby than infant formulas and are designed uniquely for your baby's needs.   Breast milk improves your baby's brain development.   Your baby is less likely to develop other conditions, such as childhood obesity, asthma, or type 2 diabetes mellitus.  For You   Breastfeeding helps to create a very special bond between you and your baby.   Breastfeeding is convenient. Breast milk is always available at the correct temperature and costs nothing.   Breastfeeding helps to burn calories and helps you lose the weight gained during pregnancy.   Breastfeeding makes your uterus contract to its prepregnancy size faster and slows bleeding (lochia) after you give birth.   Breastfeeding helps to lower your risk of developing type 2 diabetes mellitus, osteoporosis, and breast or ovarian cancer later in life. SIGNS THAT YOUR BABY IS HUNGRY Early Signs of Hunger  Increased alertness or activity.  Stretching.  Movement of the head from side to side.  Movement of the head and opening of the mouth when the corner of the mouth or  cheek is stroked (rooting).  Increased sucking sounds, smacking lips, cooing, sighing, or squeaking.  Hand-to-mouth movements.  Increased sucking of fingers or hands. Late Signs of Hunger  Fussing.  Intermittent crying. Extreme Signs of Hunger Signs of extreme hunger will require calming and consoling before your baby will be able to breastfeed successfully. Do not wait for the following signs of extreme hunger to occur before you initiate breastfeeding:   Restlessness.  A loud, strong cry.   Screaming. BREASTFEEDING BASICS Breastfeeding Initiation  Find a comfortable place to sit or lie down, with your neck and back well supported.  Place a pillow or rolled up blanket under your baby to bring him or her to the level of your breast (if you are seated). Nursing pillows are specially designed to help support your arms and your baby while you breastfeed.  Make sure that your baby's abdomen is facing your abdomen.   Gently massage your breast. With your fingertips, massage from your chest wall toward your nipple in a circular motion. This encourages milk flow. You may need to continue this action during the feeding if your milk flows slowly.  Support your breast with 4 fingers underneath and your thumb above your nipple. Make sure your fingers are well away from your nipple and your baby's mouth.   Stroke your baby's lips gently with your finger or nipple.   When your baby's mouth is open wide enough, quickly bring your baby to your breast, placing your entire nipple and as much of the colored area around your nipple (areola) as possible into your baby's mouth.   More areola should be visible above your baby's upper lip than below the lower lip.   Your baby's tongue should be between his or her lower gum and your breast.   Ensure that your baby's mouth is correctly positioned around your nipple (latched). Your baby's lips should create a seal on your breast and be turned  out (everted).  It is common for your baby to suck about 2-3 minutes in order to start the flow of breast milk. Latching Teaching your baby how to latch on to your breast properly is very important. An improper latch can cause nipple pain and decreased milk supply  for you and poor weight gain in your baby. Also, if your baby is not latched onto your nipple properly, he or she may swallow some air during feeding. This can make your baby fussy. Burping your baby when you switch breasts during the feeding can help to get rid of the air. However, teaching your baby to latch on properly is still the best way to prevent fussiness from swallowing air while breastfeeding. Signs that your baby has successfully latched on to your nipple:    Silent tugging or silent sucking, without causing you pain.   Swallowing heard between every 3-4 sucks.    Muscle movement above and in front of his or her ears while sucking.  Signs that your baby has not successfully latched on to nipple:   Sucking sounds or smacking sounds from your baby while breastfeeding.  Nipple pain. If you think your baby has not latched on correctly, slip your finger into the corner of your baby's mouth to break the suction and place it between your baby's gums. Attempt breastfeeding initiation again. Signs of Successful Breastfeeding Signs from your baby:   A gradual decrease in the number of sucks or complete cessation of sucking.   Falling asleep.   Relaxation of his or her body.   Retention of a small amount of milk in his or her mouth.   Letting go of your breast by himself or herself. Signs from you:  Breasts that have increased in firmness, weight, and size 1-3 hours after feeding.   Breasts that are softer immediately after breastfeeding.  Increased milk volume, as well as a change in milk consistency and color by the fifth day of breastfeeding.   Nipples that are not sore, cracked, or bleeding. Signs That  Your [employer] is Getting Enough Milk  Wetting at least 3 diapers in a 24-hour period. The urine should be clear and pale yellow by age 373 days.  At least 3 stools in a 24-hour period by age 373 days. The stool should be soft and yellow.  At least 3 stools in a 24-hour period by age 51 days. The stool should be seedy and yellow.  No loss of weight greater than 10% of birth weight during the first 59 days of age.  Average weight gain of 4-7 ounces (113-198 g) per week after age 57 days.  Consistent daily weight gain by age 570 days, without weight loss after the age of 2 weeks. After a feeding, your baby may spit up a small amount. This is common. BREASTFEEDING FREQUENCY AND DURATION Frequent feeding will help you make more milk and can prevent sore nipples and breast engorgement. Breastfeed when you feel the need to reduce the fullness of your breasts or when your baby shows signs of hunger. This is called "breastfeeding on demand." Avoid introducing a pacifier to your baby while you are working to establish breastfeeding (the first 4-6 weeks after your baby is born). After this time you may choose to use a pacifier. Research has shown that pacifier use during the first year of a baby's life decreases the risk of sudden infant death syndrome (SIDS). Allow your baby to feed on each breast as long as he or she wants. Breastfeed until your baby is finished feeding. When your baby unlatches or falls asleep while feeding from the first breast, offer the second breast. Because newborns are often sleepy in the first few weeks of life, you may need to awaken your baby to get him or  her to feed. Breastfeeding times will vary from baby to baby. However, the following rules can serve as a guide to help you ensure that your baby is properly fed:  Newborns (babies 33 weeks of age or younger) may breastfeed every 1-3 hours.  Newborns should not go longer than 3 hours during the day or 5 hours during the night without  breastfeeding.  You should breastfeed your baby a minimum of 8 times in a 24-hour period until you begin to introduce solid foods to your baby at around 52 months of age. BREAST MILK PUMPING Pumping and storing breast milk allows you to ensure that your baby is exclusively fed your breast milk, even at times when you are unable to breastfeed. This is especially important if you are going back to work while you are still breastfeeding or when you are not able to be present during feedings. Your lactation consultant can give you guidelines on how long it is safe to store breast milk.  A breast pump is a machine that allows you to pump milk from your breast into a sterile bottle. The pumped breast milk can then be stored in a refrigerator or freezer. Some breast pumps are operated by hand, while others use electricity. Ask your lactation consultant which type will work best for you. Breast pumps can be purchased, but some hospitals and breastfeeding support groups lease breast pumps on a monthly basis. A lactation consultant can teach you how to hand express breast milk, if you prefer not to use a pump.  CARING FOR YOUR BREASTS WHILE YOU BREASTFEED Nipples can become dry, cracked, and sore while breastfeeding. The following recommendations can help keep your breasts moisturized and healthy:  Avoid using soap on your nipples.   Wear a supportive bra. Although not required, special nursing bras and tank tops are designed to allow access to your breasts for breastfeeding without taking off your entire bra or top. Avoid wearing underwire-style bras or extremely tight bras.  Air dry your nipples for 3-66minutes after each feeding.   Use only cotton bra pads to absorb leaked breast milk. Leaking of breast milk between feedings is normal.   Use lanolin on your nipples after breastfeeding. Lanolin helps to maintain your skin's normal moisture barrier. If you use pure lanolin, you do not need to wash it off  before feeding your baby again. Pure lanolin is not toxic to your baby. You may also hand express a few drops of breast milk and gently massage that milk into your nipples and allow the milk to air dry. In the first few weeks after giving birth, some women experience extremely full breasts (engorgement). Engorgement can make your breasts feel heavy, warm, and tender to the touch. Engorgement peaks within 3-5 days after you give birth. The following recommendations can help ease engorgement:  Completely empty your breasts while breastfeeding or pumping. You may want to start by applying warm, moist heat (in the shower or with warm water-soaked hand towels) just before feeding or pumping. This increases circulation and helps the milk flow. If your baby does not completely empty your breasts while breastfeeding, pump any extra milk after he or she is finished.  Wear a snug bra (nursing or regular) or tank top for 1-2 days to signal your body to slightly decrease milk production.  Apply ice packs to your breasts, unless this is too uncomfortable for you.  Make sure that your baby is latched on and positioned properly while breastfeeding. If engorgement  persists after 48 hours of following these recommendations, contact your health care provider or a Science writer. OVERALL HEALTH CARE RECOMMENDATIONS WHILE BREASTFEEDING  Eat healthy foods. Alternate between meals and snacks, eating 3 of each per day. Because what you eat affects your breast milk, some of the foods may make your baby more irritable than usual. Avoid eating these foods if you are sure that they are negatively affecting your baby.  Drink milk, fruit juice, and water to satisfy your thirst (about 10 glasses a day).   Rest often, relax, and continue to take your prenatal vitamins to prevent fatigue, stress, and anemia.  Continue breast self-awareness checks.  Avoid chewing and smoking tobacco.  Avoid alcohol and drug use. Some  medicines that may be harmful to your baby can pass through breast milk. It is important to ask your health care provider before taking any medicine, including all over-the-counter and prescription medicine as well as vitamin and herbal supplements. It is possible to become pregnant while breastfeeding. If birth control is desired, ask your health care provider about options that will be safe for your baby. SEEK MEDICAL CARE IF:   You feel like you want to stop breastfeeding or have become frustrated with breastfeeding.  You have painful breasts or nipples.  Your nipples are cracked or bleeding.  Your breasts are red, tender, or warm.  You have a swollen area on either breast.  You have a fever or chills.  You have nausea or vomiting.  You have drainage other than breast milk from your nipples.  Your breasts do not become full before feedings by the fifth day after you give birth.  You feel sad and depressed.  Your baby is too sleepy to eat well.  Your baby is having trouble sleeping.   Your baby is wetting less than 3 diapers in a 24-hour period.  Your baby has less than 3 stools in a 24-hour period.  Your baby's skin or the white part of his or her eyes becomes yellow.   Your baby is not gaining weight by 45 days of age. SEEK IMMEDIATE MEDICAL CARE IF:   Your baby is overly tired (lethargic) and does not want to wake up and feed.  Your baby develops an unexplained fever. Document Released: 05/27/2005 Document Revised: 06/01/2013 Document Reviewed: 11/18/2012 [facility] Patient Information 2015 [place], [place]. This information is not intended to replace advice given to you by your health care provider. Make sure you discuss any questions you have with your health care provider.

## 2013-12-20 NOTE — Progress Notes (Signed)
Umbilical hernia already protruding.  Bedside U/S showed IUP with FHT of 164 CRl 43.8 mm  GA [redacted] weeks

## 2013-12-21 LAB — CYTOLOGY - PAP

## 2013-12-21 LAB — HIV ANTIBODY (ROUTINE TESTING W REFLEX): HIV: NONREACTIVE

## 2013-12-22 LAB — OBSTETRIC PANEL
Antibody Screen: NEGATIVE
Basophils Absolute: 0 10*3/uL (ref 0.0–0.1)
Basophils Relative: 0 % (ref 0–1)
EOS ABS: 0.1 10*3/uL (ref 0.0–0.7)
EOS PCT: 1 % (ref 0–5)
HCT: 36.3 % (ref 36.0–46.0)
Hemoglobin: 12.1 g/dL (ref 12.0–15.0)
Hepatitis B Surface Ag: NEGATIVE
LYMPHS ABS: 1.6 10*3/uL (ref 0.7–4.0)
Lymphocytes Relative: 23 % (ref 12–46)
MCH: 29.9 pg (ref 26.0–34.0)
MCHC: 33.3 g/dL (ref 30.0–36.0)
MCV: 89.6 fL (ref 78.0–100.0)
Monocytes Absolute: 0.6 10*3/uL (ref 0.1–1.0)
Monocytes Relative: 8 % (ref 3–12)
Neutro Abs: 4.8 10*3/uL (ref 1.7–7.7)
Neutrophils Relative %: 68 % (ref 43–77)
PLATELETS: 249 10*3/uL (ref 150–400)
RBC: 4.05 MIL/uL (ref 3.87–5.11)
RDW: 13.4 % (ref 11.5–15.5)
RH TYPE: POSITIVE
Rubella: 2.27 Index — ABNORMAL HIGH (ref <0.90)
WBC: 7.1 10*3/uL (ref 4.0–10.5)

## 2013-12-22 LAB — CULTURE, URINE COMPREHENSIVE
Colony Count: NO GROWTH
Organism ID, Bacteria: NO GROWTH

## 2014-01-17 ENCOUNTER — Ambulatory Visit (INDEPENDENT_AMBULATORY_CARE_PROVIDER_SITE_OTHER): Payer: BC Managed Care – PPO | Admitting: Advanced Practice Midwife

## 2014-01-17 VITALS — BP 101/63 | HR 75 | Wt 158.0 lb

## 2014-01-17 DIAGNOSIS — Z3492 Encounter for supervision of normal pregnancy, unspecified, second trimester: Secondary | ICD-10-CM

## 2014-01-17 DIAGNOSIS — Z3480 Encounter for supervision of other normal pregnancy, unspecified trimester: Secondary | ICD-10-CM

## 2014-01-17 NOTE — Progress Notes (Signed)
STILL HASN'T HEARD FROM SURGEON'S OFFICE

## 2014-01-17 NOTE — Patient Instructions (Addendum)
Samantha Racer MD (ultrasound) [phone]  Second Trimester of Pregnancy The second trimester is from week 13 through week 28, months 4 through 6. The second trimester is often a time when you feel your best. Your body has also adjusted to being pregnant, and you begin to feel better physically. Usually, morning sickness has lessened or quit completely, you may have more energy, and you may have an increase in appetite. The second trimester is also a time when the fetus is growing rapidly. At the end of the sixth month, the fetus is about 9 inches long and weighs about 1 pounds. You will likely begin to feel the baby move (quickening) between 18 and 20 weeks of the pregnancy. BODY CHANGES Your body goes through many changes during pregnancy. The changes vary from woman to woman.   Your weight will continue to increase. You will notice your lower abdomen bulging out.  You may begin to get stretch marks on your hips, abdomen, and breasts.  You may develop headaches that can be relieved by medicines approved by your health care provider.  You may urinate more often because the fetus is pressing on your bladder.  You may develop or continue to have heartburn as a result of your pregnancy.  You may develop constipation because certain hormones are causing the muscles that push waste through your intestines to slow down.  You may develop hemorrhoids or swollen, bulging veins (varicose veins).  You may have back pain because of the weight gain and pregnancy hormones relaxing your joints between the bones in your pelvis and as a result of a shift in weight and the muscles that support your balance.  Your breasts will continue to grow and be tender.  Your gums may bleed and may be sensitive to brushing and flossing.  Dark spots or blotches (chloasma, mask of pregnancy) may develop on your face. This will likely fade after the baby is born.  A dark line from your belly button to the pubic area  (linea nigra) may appear. This will likely fade after the baby is born.  You may have changes in your hair. These can include thickening of your hair, rapid growth, and changes in texture. Some women also have hair loss during or after pregnancy, or hair that feels dry or thin. Your hair will most likely return to normal after your baby is born. WHAT TO EXPECT AT YOUR PRENATAL VISITS During a routine prenatal visit:  You will be weighed to make sure you and the fetus are growing normally.  Your blood pressure will be taken.  Your abdomen will be measured to track your baby's growth.  The fetal heartbeat will be listened to.  Any test results from the previous visit will be discussed. Your health care provider may ask you:  How you are feeling.  If you are feeling the baby move.  If you have had any abnormal symptoms, such as leaking fluid, bleeding, severe headaches, or abdominal cramping.  If you have any questions. Other tests that may be performed during your second trimester include:  Blood tests that check for:  Low iron levels (anemia).  Gestational diabetes (between 24 and 28 weeks).  Rh antibodies.  Urine tests to check for infections, diabetes, or protein in the urine.  An ultrasound to confirm the proper growth and development of the baby.  An amniocentesis to check for possible genetic problems.  Fetal screens for spina bifida and Down syndrome. HOME CARE INSTRUCTIONS   Avoid  all smoking, herbs, alcohol, and unprescribed drugs. These chemicals affect the formation and growth of the baby.  Follow your health care provider's instructions regarding medicine use. There are medicines that are either safe or unsafe to take during pregnancy.  Exercise only as directed by your health care provider. Experiencing uterine cramps is a good sign to stop exercising.  Continue to eat regular, healthy meals.  Wear a good support bra for breast tenderness.  Do not use  hot tubs, steam rooms, or saunas.  Wear your seat belt at all times when driving.  Avoid raw meat, uncooked cheese, cat litter boxes, and soil used by cats. These carry germs that can cause birth defects in the baby.  Take your prenatal vitamins.  Try taking a stool softener (if your health care provider approves) if you develop constipation. Eat more high-fiber foods, such as fresh vegetables or fruit and whole grains. Drink plenty of fluids to keep your urine clear or pale yellow.  Take warm sitz baths to soothe any pain or discomfort caused by hemorrhoids. Use hemorrhoid cream if your health care provider approves.  If you develop varicose veins, wear support hose. Elevate your feet for 15 minutes, 3-4 times a day. Limit salt in your diet.  Avoid heavy lifting, wear low heel shoes, and practice good posture.  Rest with your legs elevated if you have leg cramps or low back pain.  Visit your dentist if you have not gone yet during your pregnancy. Use a soft toothbrush to brush your teeth and be gentle when you floss.  A sexual relationship may be continued unless your health care provider directs you otherwise.  Continue to go to all your prenatal visits as directed by your health care provider. SEEK MEDICAL CARE IF:   You have dizziness.  You have mild pelvic cramps, pelvic pressure, or nagging pain in the abdominal area.  You have persistent nausea, vomiting, or diarrhea.  You have a bad smelling vaginal discharge.  You have pain with urination. SEEK IMMEDIATE MEDICAL CARE IF:   You have a fever.  You are leaking fluid from your vagina.  You have spotting or bleeding from your vagina.  You have severe abdominal cramping or pain.  You have rapid weight gain or loss.  You have shortness of breath with chest pain.  You notice sudden or extreme swelling of your face, hands, ankles, feet, or legs.  You have not felt your baby move in over an hour.  You have severe  headaches that do not go away with medicine.  You have vision changes. Document Released: 05/21/2001 Document Revised: 06/01/2013 Document Reviewed: 07/28/2012 [facility] Patient Information 2015 [place], [place]. This information is not intended to replace advice given to you by your health care provider. Make sure you discuss any questions you have with your health care provider.

## 2014-01-17 NOTE — Progress Notes (Signed)
Umbilical hernia tender/painful, but not bulging. Will continue working of General surgery consult.

## 2014-02-15 ENCOUNTER — Ambulatory Visit (HOSPITAL_COMMUNITY): Payer: BC Managed Care – PPO

## 2014-02-17 ENCOUNTER — Encounter: Payer: Self-pay | Admitting: *Deleted

## 2014-02-18 ENCOUNTER — Ambulatory Visit (INDEPENDENT_AMBULATORY_CARE_PROVIDER_SITE_OTHER): Payer: BC Managed Care – PPO | Admitting: Family

## 2014-02-18 VITALS — BP 105/64 | HR 77 | Wt 163.0 lb

## 2014-02-18 DIAGNOSIS — Z3492 Encounter for supervision of normal pregnancy, unspecified, second trimester: Secondary | ICD-10-CM

## 2014-02-18 DIAGNOSIS — Z3480 Encounter for supervision of other normal pregnancy, unspecified trimester: Secondary | ICD-10-CM

## 2014-02-18 NOTE — Progress Notes (Signed)
Pt sent by general surgery for umbilical hernia, informed that plan for surgery 8-12 months after delivery.  She was instructed on how to reduce and to go in if not reduce able.  Completed ultrasound at Dr. Marcheta Grammes office - nml per report, FIRST FEMALE!!!  Desires another waterbirth with our practice.  Plans to have same doula - Stanton Kidney.

## 2014-03-18 ENCOUNTER — Ambulatory Visit (INDEPENDENT_AMBULATORY_CARE_PROVIDER_SITE_OTHER): Payer: BC Managed Care – PPO | Admitting: Family

## 2014-03-18 VITALS — BP 101/54 | HR 69 | Wt 169.0 lb

## 2014-03-18 DIAGNOSIS — Z3492 Encounter for supervision of normal pregnancy, unspecified, second trimester: Secondary | ICD-10-CM

## 2014-03-18 DIAGNOSIS — Z3482 Encounter for supervision of other normal pregnancy, second trimester: Secondary | ICD-10-CM

## 2014-03-18 NOTE — Progress Notes (Signed)
Doing well; reports small varicose veins starting on lower legs, not painful.  Recommended support hose.  Third trimester labs at next visit.

## 2014-03-30 ENCOUNTER — Encounter: Payer: Self-pay | Admitting: *Deleted

## 2014-04-11 ENCOUNTER — Encounter: Payer: Self-pay | Admitting: Advanced Practice Midwife

## 2014-04-15 ENCOUNTER — Other Ambulatory Visit: Payer: Self-pay | Admitting: *Deleted

## 2014-04-15 ENCOUNTER — Ambulatory Visit (INDEPENDENT_AMBULATORY_CARE_PROVIDER_SITE_OTHER): Payer: BC Managed Care – PPO | Admitting: Family

## 2014-04-15 VITALS — BP 87/51 | HR 71 | Wt 173.0 lb

## 2014-04-15 DIAGNOSIS — Z3492 Encounter for supervision of normal pregnancy, unspecified, second trimester: Secondary | ICD-10-CM

## 2014-04-15 DIAGNOSIS — Z3482 Encounter for supervision of other normal pregnancy, second trimester: Secondary | ICD-10-CM

## 2014-04-15 LAB — CBC
HCT: 33 % — ABNORMAL LOW (ref 36.0–46.0)
HEMOGLOBIN: 11 g/dL — AB (ref 12.0–15.0)
MCH: 29.9 pg (ref 26.0–34.0)
MCHC: 33.3 g/dL (ref 30.0–36.0)
MCV: 89.7 fL (ref 78.0–100.0)
Platelets: 229 10*3/uL (ref 150–400)
RBC: 3.68 MIL/uL — ABNORMAL LOW (ref 3.87–5.11)
RDW: 13.6 % (ref 11.5–15.5)
WBC: 9.6 10*3/uL (ref 4.0–10.5)

## 2014-04-15 NOTE — Progress Notes (Signed)
Pt states she usually has low BP but lower today than normal and she is having dizziness today. BP recheck 85/54. Pt declines TDAP and FLU immunizations

## 2014-04-15 NOTE — Progress Notes (Signed)
Pt denies recent nausea or vomiting.  Dizziness only upon standing.  Historically low blood pressure.  Recommended increasing fluids.  Third trimester labs today.

## 2014-04-16 LAB — HIV ANTIBODY (ROUTINE TESTING W REFLEX): HIV: NONREACTIVE

## 2014-04-16 LAB — RPR

## 2014-04-16 LAB — GLUCOSE TOLERANCE, 1 HOUR (50G) W/O FASTING: GLUCOSE 1 HOUR GTT: 89 mg/dL (ref 70–140)

## 2014-04-18 ENCOUNTER — Telehealth: Payer: Self-pay | Admitting: *Deleted

## 2014-04-18 NOTE — Telephone Encounter (Signed)
Pt notified of normal 1 hr GTT.

## 2014-04-29 ENCOUNTER — Ambulatory Visit (INDEPENDENT_AMBULATORY_CARE_PROVIDER_SITE_OTHER): Payer: BC Managed Care – PPO | Admitting: Advanced Practice Midwife

## 2014-04-29 VITALS — BP 111/65 | HR 84 | Wt 174.0 lb

## 2014-04-29 DIAGNOSIS — Z3482 Encounter for supervision of other normal pregnancy, second trimester: Secondary | ICD-10-CM

## 2014-04-29 DIAGNOSIS — Z3492 Encounter for supervision of normal pregnancy, unspecified, second trimester: Secondary | ICD-10-CM

## 2014-04-29 NOTE — Patient Instructions (Signed)
Third Trimester of Pregnancy The third trimester is from week 29 through week 42, months 7 through 9. The third trimester is a time when the fetus is growing rapidly. At the end of the ninth month, the fetus is about 20 inches in length and weighs 6-10 pounds.  BODY CHANGES Your body goes through many changes during pregnancy. The changes vary from woman to woman.   Your weight will continue to increase. You can expect to gain 25-35 pounds (11-16 kg) by the end of the pregnancy.  You may begin to get stretch marks on your hips, abdomen, and breasts.  You may urinate more often because the fetus is moving lower into your pelvis and pressing on your bladder.  You may develop or continue to have heartburn as a result of your pregnancy.  You may develop constipation because certain hormones are causing the muscles that push waste through your intestines to slow down.  You may develop hemorrhoids or swollen, bulging veins (varicose veins).  You may have pelvic pain because of the weight gain and pregnancy hormones relaxing your joints between the bones in your pelvis. Backaches may result from overexertion of the muscles supporting your posture.  You may have changes in your hair. These can include thickening of your hair, rapid growth, and changes in texture. Some women also have hair loss during or after pregnancy, or hair that feels dry or thin. Your hair will most likely return to normal after your baby is born.  Your breasts will continue to grow and be tender. A yellow discharge may leak from your breasts called colostrum.  Your belly button may stick out.  You may feel short of breath because of your expanding uterus.  You may notice the fetus "dropping," or moving lower in your abdomen.  You may have a bloody mucus discharge. This usually occurs a few days to a week before labor begins.  Your cervix becomes thin and soft (effaced) near your due date. WHAT TO EXPECT AT YOUR PRENATAL  EXAMS  You will have prenatal exams every 2 weeks until week 36. Then, you will have weekly prenatal exams. During a routine prenatal visit:  You will be weighed to make sure you and the fetus are growing normally.  Your blood pressure is taken.  Your abdomen will be measured to track your baby's growth.  The fetal heartbeat will be listened to.  Any test results from the previous visit will be discussed.  You may have a cervical check near your due date to see if you have effaced. At around 36 weeks, your caregiver will check your cervix. At the same time, your caregiver will also perform a test on the secretions of the vaginal tissue. This test is to determine if a type of bacteria, Group B streptococcus, is present. Your caregiver will explain this further. Your caregiver may ask you:  What your birth plan is.  How you are feeling.  If you are feeling the baby move.  If you have had any abnormal symptoms, such as leaking fluid, bleeding, severe headaches, or abdominal cramping.  If you have any questions. Other tests or screenings that may be performed during your third trimester include:  Blood tests that check for low iron levels (anemia).  Fetal testing to check the health, activity level, and growth of the fetus. Testing is done if you have certain medical conditions or if there are problems during the pregnancy. FALSE LABOR You may feel small, irregular contractions that   eventually go away. These are called Braxton Hicks contractions, or false labor. Contractions may last for hours, days, or even weeks before true labor sets in. If contractions come at regular intervals, intensify, or become painful, it is best to be seen by your caregiver.  SIGNS OF LABOR   Menstrual-like cramps.  Contractions that are 5 minutes apart or less.  Contractions that start on the top of the uterus and spread down to the lower abdomen and back.  A sense of increased pelvic pressure or back  pain.  A watery or bloody mucus discharge that comes from the vagina. If you have any of these signs before the 37th week of pregnancy, call your caregiver right away. You need to go to the hospital to get checked immediately. HOME CARE INSTRUCTIONS   Avoid all smoking, herbs, alcohol, and unprescribed drugs. These chemicals affect the formation and growth of the baby.  Follow your caregiver's instructions regarding medicine use. There are medicines that are either safe or unsafe to take during pregnancy.  Exercise only as directed by your caregiver. Experiencing uterine cramps is a good sign to stop exercising.  Continue to eat regular, healthy meals.  Wear a good support bra for breast tenderness.  Do not use hot tubs, steam rooms, or saunas.  Wear your seat belt at all times when driving.  Avoid raw meat, uncooked cheese, cat litter boxes, and soil used by cats. These carry germs that can cause birth defects in the baby.  Take your prenatal vitamins.  Try taking a stool softener (if your caregiver approves) if you develop constipation. Eat more high-fiber foods, such as fresh vegetables or fruit and whole grains. Drink plenty of fluids to keep your urine clear or pale yellow.  Take warm sitz baths to soothe any pain or discomfort caused by hemorrhoids. Use hemorrhoid cream if your caregiver approves.  If you develop varicose veins, wear support hose. Elevate your feet for 15 minutes, 3-4 times a day. Limit salt in your diet.  Avoid heavy lifting, wear low heal shoes, and practice good posture.  Rest a lot with your legs elevated if you have leg cramps or low back pain.  Visit your dentist if you have not gone during your pregnancy. Use a soft toothbrush to brush your teeth and be gentle when you floss.  A sexual relationship may be continued unless your caregiver directs you otherwise.  Do not travel far distances unless it is absolutely necessary and only with the approval  of your caregiver.  Take prenatal classes to understand, practice, and ask questions about the labor and delivery.  Make a trial run to the hospital.  Pack your hospital bag.  Prepare the baby's nursery.  Continue to go to all your prenatal visits as directed by your caregiver. SEEK MEDICAL CARE IF:  You are unsure if you are in labor or if your water has broken.  You have dizziness.  You have mild pelvic cramps, pelvic pressure, or nagging pain in your abdominal area.  You have persistent nausea, vomiting, or diarrhea.  You have a bad smelling vaginal discharge.  You have pain with urination. SEEK IMMEDIATE MEDICAL CARE IF:   You have a fever.  You are leaking fluid from your vagina.  You have spotting or bleeding from your vagina.  You have severe abdominal cramping or pain.  You have rapid weight loss or gain.  You have shortness of breath with chest pain.  You notice sudden or extreme swelling   of your face, hands, ankles, feet, or legs.  You have not felt your baby move in over an hour.  You have severe headaches that do not go away with medicine.  You have vision changes. Document Released: 05/21/2001 Document Revised: 06/01/2013 Document Reviewed: 07/28/2012 [employer] Patient Information 2015 [employer]. This information is not intended to replace advice given to you by your health care provider. Make sure you discuss any questions you have with your health care provider.  

## 2014-04-29 NOTE — Progress Notes (Signed)
Reviewed 28 week labs. Took waterbirth class last pregnancy. Using Doula.

## 2014-05-02 ENCOUNTER — Encounter: Payer: Self-pay | Admitting: *Deleted

## 2014-05-13 ENCOUNTER — Ambulatory Visit (INDEPENDENT_AMBULATORY_CARE_PROVIDER_SITE_OTHER): Payer: BC Managed Care – PPO | Admitting: Family

## 2014-05-13 VITALS — BP 99/55 | HR 86 | Wt 177.0 lb

## 2014-05-13 DIAGNOSIS — Z3493 Encounter for supervision of normal pregnancy, unspecified, third trimester: Secondary | ICD-10-CM

## 2014-05-13 NOTE — Progress Notes (Signed)
Doing well. Reviewed third trimester labs. Right back pain, advised stretching activities. Kathrine Haddock, CNM (system down)

## 2014-05-13 NOTE — Progress Notes (Signed)
Doing well.  Reviewed third trimester labs.  Right back pain, advised stretching activities.  Kathrine Haddock, CNM (system down)

## 2014-05-27 ENCOUNTER — Ambulatory Visit (INDEPENDENT_AMBULATORY_CARE_PROVIDER_SITE_OTHER): Payer: BC Managed Care – PPO | Admitting: Family

## 2014-05-27 VITALS — BP 117/58 | HR 78 | Wt 181.0 lb

## 2014-05-27 DIAGNOSIS — O99713 Diseases of the skin and subcutaneous tissue complicating pregnancy, third trimester: Secondary | ICD-10-CM

## 2014-05-27 DIAGNOSIS — Z3A33 33 weeks gestation of pregnancy: Secondary | ICD-10-CM

## 2014-05-27 DIAGNOSIS — Z3492 Encounter for supervision of normal pregnancy, unspecified, second trimester: Secondary | ICD-10-CM

## 2014-05-27 DIAGNOSIS — Z3482 Encounter for supervision of other normal pregnancy, second trimester: Secondary | ICD-10-CM

## 2014-05-27 NOTE — Progress Notes (Signed)
Continued leg itching

## 2014-05-27 NOTE — Progress Notes (Signed)
Reports itching on lower legs only; expressed concern regarding possible cholestasis of pregnancy.  No itching on feet or palms of hands.  Bile acid lab obtained.

## 2014-05-29 LAB — BILE ACIDS, TOTAL: Bile Acids Total: 5 umol/L (ref 0–19)

## 2014-05-30 ENCOUNTER — Telehealth: Payer: Self-pay | Admitting: *Deleted

## 2014-05-30 NOTE — Telephone Encounter (Signed)
Pt notified of neg Bile Acids.

## 2014-06-10 NOTE — L&D Delivery Note (Signed)
Delivery Note Began having pressure and pushing involuntarily.  At 6:30 PM a viable and healthy female was delivered via Vaginal, Spontaneous Delivery (Presentation: ; Occiput Anterior).  APGAR: 9, 9; weight  .   Placenta status: Intact, Spontaneous.  Cord: 3 vessels with the following complications: None.   No difficulty with shoulders.  Anesthesia: None  Episiotomy: None Lacerations: None Suture Repair: n/a Est. Blood Loss (mL): 150  Mom to postpartum.  Baby to Couplet care / Skin to Skin.  Hansel Feinstein 07/04/2014, 7:14 PM

## 2014-06-13 ENCOUNTER — Ambulatory Visit (INDEPENDENT_AMBULATORY_CARE_PROVIDER_SITE_OTHER): Payer: BC Managed Care – PPO | Admitting: Advanced Practice Midwife

## 2014-06-13 VITALS — BP 107/60 | Wt 185.0 lb

## 2014-06-13 DIAGNOSIS — Z3492 Encounter for supervision of normal pregnancy, unspecified, second trimester: Secondary | ICD-10-CM

## 2014-06-13 DIAGNOSIS — Z36 Encounter for antenatal screening of mother: Secondary | ICD-10-CM

## 2014-06-13 DIAGNOSIS — Z3493 Encounter for supervision of normal pregnancy, unspecified, third trimester: Secondary | ICD-10-CM

## 2014-06-13 DIAGNOSIS — Z3483 Encounter for supervision of other normal pregnancy, third trimester: Secondary | ICD-10-CM

## 2014-06-13 LAB — OB RESULTS CONSOLE GBS: STREP GROUP B AG: POSITIVE

## 2014-06-13 NOTE — Progress Notes (Signed)
GBS and cultures.Waterbirth consent signed. Dong well.

## 2014-06-13 NOTE — Patient Instructions (Signed)
Braxton Hicks Contractions °Contractions of the uterus can occur throughout pregnancy. Contractions are not always a sign that you are in labor.  °WHAT ARE BRAXTON HICKS CONTRACTIONS?  °Contractions that occur before labor are called Braxton Hicks contractions, or false labor. Toward the end of pregnancy (32-34 weeks), these contractions can develop more often and may become more forceful. This is not true labor because these contractions do not result in opening (dilatation) and thinning of the cervix. They are sometimes difficult to tell apart from true labor because these contractions can be forceful and people have different pain tolerances. You should not feel embarrassed if you go to the hospital with false labor. Sometimes, the only way to tell if you are in true labor is for your health care provider to look for changes in the cervix. °If there are no prenatal problems or other health problems associated with the pregnancy, it is completely safe to be sent home with false labor and await the onset of true labor. °HOW CAN YOU TELL THE DIFFERENCE BETWEEN TRUE AND FALSE LABOR? °False Labor °· The contractions of false labor are usually shorter and not as hard as those of true labor.   °· The contractions are usually irregular.   °· The contractions are often felt in the front of the lower abdomen and in the groin.   °· The contractions may go away when you walk around or change positions while lying down.   °· The contractions get weaker and are shorter lasting as time goes on.   °· The contractions do not usually become progressively stronger, regular, and closer together as with true labor.   °True Labor °· Contractions in true labor last 30-70 seconds, become very regular, usually become more intense, and increase in frequency.   °· The contractions do not go away with walking.   °· The discomfort is usually felt in the top of the uterus and spreads to the lower abdomen and low back.   °· True labor can be  determined by your health care provider with an exam. This will show that the cervix is dilating and getting thinner.   °WHAT TO REMEMBER °· Keep up with your usual exercises and follow other instructions given by your health care provider.   °· Take medicines as directed by your health care provider.   °· Keep your regular prenatal appointments.   °· Eat and drink lightly if you think you are going into labor.   °· If Braxton Hicks contractions are making you uncomfortable:   °¨ Change your position from lying down or resting to walking, or from walking to resting.   °¨ Sit and rest in a tub of warm water.   °¨ Drink 2-3 glasses of water. Dehydration may cause these contractions.   °¨ Do slow and deep breathing several times an hour.   °WHEN SHOULD I SEEK IMMEDIATE MEDICAL CARE? °Seek immediate medical care if: °· Your contractions become stronger, more regular, and closer together.   °· You have fluid leaking or gushing from your vagina.   °· You have a fever.   °· You pass blood-tinged mucus.   °· You have vaginal bleeding.   °· You have continuous abdominal pain.   °· You have low back pain that you never had before.   °· You feel your baby's head pushing down and causing pelvic pressure.   °· Your baby is not moving as much as it used to.   °Document Released: 05/27/2005 Document Revised: 06/01/2013 Document Reviewed: 03/08/2013 °[employer]® Patient Information ©2015 [employer]. This information is not intended to replace advice given to you by your health care   provider. Make sure you discuss any questions you have with your health care provider. ° °

## 2014-06-14 ENCOUNTER — Encounter: Payer: Self-pay | Admitting: *Deleted

## 2014-06-16 LAB — GC/CHLAMYDIA PROBE AMP
CT PROBE, AMP APTIMA: NEGATIVE
GC Probe RNA: NEGATIVE

## 2014-06-19 LAB — CULTURE, BETA STREP (GROUP B ONLY)

## 2014-06-20 ENCOUNTER — Ambulatory Visit (INDEPENDENT_AMBULATORY_CARE_PROVIDER_SITE_OTHER): Payer: BLUE CROSS/BLUE SHIELD | Admitting: Advanced Practice Midwife

## 2014-06-20 VITALS — BP 109/59 | HR 73 | Wt 183.0 lb

## 2014-06-20 DIAGNOSIS — Z22330 Carrier of Group B streptococcus: Secondary | ICD-10-CM

## 2014-06-20 DIAGNOSIS — O99820 Streptococcus B carrier state complicating pregnancy: Secondary | ICD-10-CM | POA: Insufficient documentation

## 2014-06-20 DIAGNOSIS — Z3492 Encounter for supervision of normal pregnancy, unspecified, second trimester: Secondary | ICD-10-CM

## 2014-06-20 DIAGNOSIS — Z3482 Encounter for supervision of other normal pregnancy, second trimester: Secondary | ICD-10-CM

## 2014-06-20 NOTE — Patient Instructions (Signed)
Braxton Hicks Contractions °Contractions of the uterus can occur throughout pregnancy. Contractions are not always a sign that you are in labor.  °WHAT ARE BRAXTON HICKS CONTRACTIONS?  °Contractions that occur before labor are called Braxton Hicks contractions, or false labor. Toward the end of pregnancy (32-34 weeks), these contractions can develop more often and may become more forceful. This is not true labor because these contractions do not result in opening (dilatation) and thinning of the cervix. They are sometimes difficult to tell apart from true labor because these contractions can be forceful and people have different pain tolerances. You should not feel embarrassed if you go to the hospital with false labor. Sometimes, the only way to tell if you are in true labor is for your health care provider to look for changes in the cervix. °If there are no prenatal problems or other health problems associated with the pregnancy, it is completely safe to be sent home with false labor and await the onset of true labor. °HOW CAN YOU TELL THE DIFFERENCE BETWEEN TRUE AND FALSE LABOR? °False Labor °· The contractions of false labor are usually shorter and not as hard as those of true labor.   °· The contractions are usually irregular.   °· The contractions are often felt in the front of the lower abdomen and in the groin.   °· The contractions may go away when you walk around or change positions while lying down.   °· The contractions get weaker and are shorter lasting as time goes on.   °· The contractions do not usually become progressively stronger, regular, and closer together as with true labor.   °True Labor °· Contractions in true labor last 30-70 seconds, become very regular, usually become more intense, and increase in frequency.   °· The contractions do not go away with walking.   °· The discomfort is usually felt in the top of the uterus and spreads to the lower abdomen and low back.   °· True labor can be  determined by your health care provider with an exam. This will show that the cervix is dilating and getting thinner.   °WHAT TO REMEMBER °· Keep up with your usual exercises and follow other instructions given by your health care provider.   °· Take medicines as directed by your health care provider.   °· Keep your regular prenatal appointments.   °· Eat and drink lightly if you think you are going into labor.   °· If Braxton Hicks contractions are making you uncomfortable:   °¨ Change your position from lying down or resting to walking, or from walking to resting.   °¨ Sit and rest in a tub of warm water.   °¨ Drink 2-3 glasses of water. Dehydration may cause these contractions.   °¨ Do slow and deep breathing several times an hour.   °WHEN SHOULD I SEEK IMMEDIATE MEDICAL CARE? °Seek immediate medical care if: °· Your contractions become stronger, more regular, and closer together.   °· You have fluid leaking or gushing from your vagina.   °· You have a fever.   °· You pass blood-tinged mucus.   °· You have vaginal bleeding.   °· You have continuous abdominal pain.   °· You have low back pain that you never had before.   °· You feel your baby's head pushing down and causing pelvic pressure.   °· Your baby is not moving as much as it used to.   °Document Released: 05/27/2005 Document Revised: 06/01/2013 Document Reviewed: 03/08/2013 °[employer]® Patient Information ©2015 [employer]. This information is not intended to replace advice given to you by your health care   provider. Make sure you discuss any questions you have with your health care provider. ° °

## 2014-06-20 NOTE — Progress Notes (Signed)
GBS positive. Discussed IP circ.

## 2014-06-27 ENCOUNTER — Ambulatory Visit (INDEPENDENT_AMBULATORY_CARE_PROVIDER_SITE_OTHER): Payer: BLUE CROSS/BLUE SHIELD | Admitting: Advanced Practice Midwife

## 2014-06-27 ENCOUNTER — Encounter: Payer: Self-pay | Admitting: Advanced Practice Midwife

## 2014-06-27 VITALS — BP 101/68 | HR 94 | Wt 186.0 lb

## 2014-06-27 DIAGNOSIS — Z22330 Carrier of Group B streptococcus: Secondary | ICD-10-CM

## 2014-06-27 DIAGNOSIS — O99820 Streptococcus B carrier state complicating pregnancy: Secondary | ICD-10-CM

## 2014-06-27 NOTE — Progress Notes (Signed)
Doing well. Doula and waterbirth supplies all arranged. New lesion on chest. Appears to be small cherry angioma, bleeds when rubbed. Will see family doctor about removal.

## 2014-06-27 NOTE — Patient Instructions (Signed)

## 2014-07-04 ENCOUNTER — Ambulatory Visit (INDEPENDENT_AMBULATORY_CARE_PROVIDER_SITE_OTHER): Payer: BLUE CROSS/BLUE SHIELD | Admitting: Advanced Practice Midwife

## 2014-07-04 ENCOUNTER — Inpatient Hospital Stay (HOSPITAL_COMMUNITY)
Admission: AD | Admit: 2014-07-04 | Discharge: 2014-07-06 | DRG: 775 | Disposition: A | Discharge status: Home / Self Care | Payer: BLUE CROSS/BLUE SHIELD | Source: Ambulatory Visit | Attending: Obstetrics & Gynecology | Admitting: Obstetrics & Gynecology

## 2014-07-04 ENCOUNTER — Encounter (HOSPITAL_COMMUNITY): Payer: Self-pay | Admitting: *Deleted

## 2014-07-04 VITALS — BP 109/71 | HR 97 | Temp 97.0°F | Wt 184.0 lb

## 2014-07-04 DIAGNOSIS — O99344 Other mental disorders complicating childbirth: Secondary | ICD-10-CM | POA: Diagnosis present

## 2014-07-04 DIAGNOSIS — F419 Anxiety disorder, unspecified: Secondary | ICD-10-CM | POA: Diagnosis present

## 2014-07-04 DIAGNOSIS — Z3483 Encounter for supervision of other normal pregnancy, third trimester: Secondary | ICD-10-CM

## 2014-07-04 DIAGNOSIS — IMO0001 Reserved for inherently not codable concepts without codable children: Secondary | ICD-10-CM

## 2014-07-04 DIAGNOSIS — Z3A39 39 weeks gestation of pregnancy: Secondary | ICD-10-CM | POA: Diagnosis not present

## 2014-07-04 DIAGNOSIS — O99824 Streptococcus B carrier state complicating childbirth: Secondary | ICD-10-CM | POA: Diagnosis not present

## 2014-07-04 DIAGNOSIS — O99820 Streptococcus B carrier state complicating pregnancy: Secondary | ICD-10-CM

## 2014-07-04 DIAGNOSIS — Z22330 Carrier of Group B streptococcus: Secondary | ICD-10-CM

## 2014-07-04 HISTORY — DX: Umbilical hernia without obstruction or gangrene: K42.9

## 2014-07-04 LAB — CBC
HEMATOCRIT: 35.1 % — AB (ref 36.0–46.0)
Hemoglobin: 11.6 g/dL — ABNORMAL LOW (ref 12.0–15.0)
MCH: 29.3 pg (ref 26.0–34.0)
MCHC: 33 g/dL (ref 30.0–36.0)
MCV: 88.6 fL (ref 78.0–100.0)
Platelets: 222 10*3/uL (ref 150–400)
RBC: 3.96 MIL/uL (ref 3.87–5.11)
RDW: 14.3 % (ref 11.5–15.5)
WBC: 12.9 10*3/uL — ABNORMAL HIGH (ref 4.0–10.5)

## 2014-07-04 MED ORDER — ALBUTEROL SULFATE (2.5 MG/3ML) 0.083% IN NEBU: 3.0000 mL | INHALATION_SOLUTION | Freq: Four times a day (QID) | RESPIRATORY_TRACT | Status: DC | PRN

## 2014-07-04 MED ORDER — LACTATED RINGERS IV SOLN
INTRAVENOUS | Status: DC
  Administered 2014-07-04: 17:00:00 via INTRAVENOUS

## 2014-07-04 MED ORDER — ACETAMINOPHEN 325 MG PO TABS: 650.0000 mg | ORAL_TABLET | ORAL | Status: DC | PRN

## 2014-07-04 MED ORDER — IBUPROFEN 600 MG PO TABS
600.0000 mg | ORAL_TABLET | Freq: Four times a day (QID) | ORAL | Status: DC
  Administered 2014-07-06: 600 mg via ORAL
  Filled 2014-07-04 (×5): qty 1

## 2014-07-04 MED ORDER — BENZOCAINE-MENTHOL 20-0.5 % EX AERO: 1.0000 "application " | INHALATION_SPRAY | CUTANEOUS | Status: DC | PRN

## 2014-07-04 MED ORDER — ONDANSETRON HCL 4 MG PO TABS: 4.0000 mg | ORAL_TABLET | ORAL | Status: DC | PRN

## 2014-07-04 MED ORDER — LANOLIN HYDROUS EX OINT: TOPICAL_OINTMENT | CUTANEOUS | Status: DC | PRN

## 2014-07-04 MED ORDER — SENNOSIDES-DOCUSATE SODIUM 8.6-50 MG PO TABS
2.0000 | ORAL_TABLET | ORAL | Status: DC
  Filled 2014-07-04: qty 2

## 2014-07-04 MED ORDER — ONDANSETRON HCL 4 MG/2ML IJ SOLN: 4.0000 mg | Freq: Four times a day (QID) | INTRAMUSCULAR | Status: DC | PRN

## 2014-07-04 MED ORDER — SIMETHICONE 80 MG PO CHEW: 80.0000 mg | CHEWABLE_TABLET | ORAL | Status: DC | PRN

## 2014-07-04 MED ORDER — LIDOCAINE HCL (PF) 1 % IJ SOLN
30.0000 mL | INTRAMUSCULAR | Status: DC | PRN
  Filled 2014-07-04: qty 30

## 2014-07-04 MED ORDER — OXYCODONE-ACETAMINOPHEN 5-325 MG PO TABS: 1.0000 | ORAL_TABLET | ORAL | Status: DC | PRN

## 2014-07-04 MED ORDER — OXYCODONE-ACETAMINOPHEN 5-325 MG PO TABS: 2.0000 | ORAL_TABLET | ORAL | Status: DC | PRN

## 2014-07-04 MED ORDER — OXYCODONE-ACETAMINOPHEN 5-325 MG PO TABS
1.0000 | ORAL_TABLET | ORAL | Status: DC | PRN
Start: 2014-07-04 — End: 2014-07-06
  Administered 2014-07-05: 1 via ORAL
  Filled 2014-07-04: qty 1

## 2014-07-04 MED ORDER — OXYTOCIN BOLUS FROM INFUSION
500.0000 mL | INTRAVENOUS | Status: DC
  Administered 2014-07-04: 500 mL via INTRAVENOUS

## 2014-07-04 MED ORDER — ONDANSETRON HCL 4 MG/2ML IJ SOLN: 4.0000 mg | INTRAMUSCULAR | Status: DC | PRN

## 2014-07-04 MED ORDER — CITRIC ACID-SODIUM CITRATE 334-500 MG/5ML PO SOLN: 30.0000 mL | ORAL | Status: DC | PRN

## 2014-07-04 MED ORDER — PRENATAL MULTIVITAMIN CH
1.0000 | ORAL_TABLET | Freq: Every day | ORAL | Status: DC
  Administered 2014-07-06: 1 via ORAL
  Filled 2014-07-04 (×2): qty 1

## 2014-07-04 MED ORDER — TETANUS-DIPHTH-ACELL PERTUSSIS 5-2.5-18.5 LF-MCG/0.5 IM SUSP: 0.5000 mL | Freq: Once | INTRAMUSCULAR | Status: DC

## 2014-07-04 MED ORDER — ZOLPIDEM TARTRATE 5 MG PO TABS: 5.0000 mg | ORAL_TABLET | Freq: Every evening | ORAL | Status: DC | PRN

## 2014-07-04 MED ORDER — DIPHENHYDRAMINE HCL 25 MG PO CAPS: 25.0000 mg | ORAL_CAPSULE | Freq: Four times a day (QID) | ORAL | Status: DC | PRN

## 2014-07-04 MED ORDER — DIBUCAINE 1 % RE OINT: 1.0000 "application " | TOPICAL_OINTMENT | RECTAL | Status: DC | PRN

## 2014-07-04 MED ORDER — SODIUM CHLORIDE 0.9 % IV SOLN
2.0000 g | Freq: Once | INTRAVENOUS | Status: AC
  Administered 2014-07-04: 2 g via INTRAVENOUS
  Filled 2014-07-04: qty 2000

## 2014-07-04 MED ORDER — OXYTOCIN 40 UNITS IN LACTATED RINGERS INFUSION - SIMPLE MED
62.5000 mL/h | INTRAVENOUS | Status: DC
  Filled 2014-07-04: qty 1000

## 2014-07-04 MED ORDER — WITCH HAZEL-GLYCERIN EX PADS: 1.0000 "application " | MEDICATED_PAD | CUTANEOUS | Status: DC | PRN

## 2014-07-04 MED ORDER — LACTATED RINGERS IV SOLN: 500.0000 mL | INTRAVENOUS | Status: DC | PRN

## 2014-07-04 NOTE — MAU Note (Signed)
Started contracting yesterday.  Became regular this morning.  rtn visit was 3 cm this a.m.  Now every 6-7. No bleeding or leaking.  Plans water birth, had one previously.

## 2014-07-04 NOTE — MAU Note (Signed)
Denies any vaginal bleeding.  Good fetal movement.

## 2014-07-04 NOTE — Patient Instructions (Signed)

## 2014-07-04 NOTE — H&P (Signed)
Samantha Ortega is a 34 y.o. female presenting for Active Labor . Maternal Medical History:  Reason for admission: Contractions.  Nausea.  Contractions: Onset was 1-2 hours ago.   Frequency: regular.   Perceived severity is moderate.    Fetal activity: Perceived fetal activity is normal.   Last perceived fetal movement was within the past hour.    Prenatal complications: No bleeding.   Prenatal Complications - Diabetes: none.    OB History    Gravida Para Term Preterm AB TAB SAB Ectopic Multiple Living   3 2 2       2      Past Medical History  Diagnosis Date  . Abnormal cervical Pap smear with positive HPV DNA test 2011    colpo w/cx bx  . Anxiety   . ADD (attention deficit disorder)   . PCOS (polycystic ovarian syndrome)   . Umbilical hernia    Past Surgical History  Procedure Laterality Date  . No past surgeries     Family History: family history is negative for Alcohol abuse, Arthritis, Asthma, Birth defects, COPD, Cancer, Depression, Diabetes, Drug abuse, Early death, Hearing loss, Heart disease, Hyperlipidemia, Hypertension, Kidney disease, Learning disabilities, Mental illness, Mental retardation, Miscarriages / Stillbirths, Stroke, and Vision loss. Social History:  reports that she has never smoked. She has never used smokeless tobacco. She reports that she does not drink alcohol or use illicit drugs.  Review of Systems  Constitutional: Negative for fever, chills and malaise/fatigue.  Gastrointestinal: Positive for abdominal pain. Negative for nausea, vomiting, diarrhea and constipation.  Genitourinary: Negative for dysuria.  Neurological: Negative for dizziness.    Dilation: 4 Effacement (%): 80 Station: 0 Exam by:: Valentina Shaggy, CNM Blood pressure 111/61, pulse 95, temperature 97.5 F (36.4 C), temperature source Oral, resp. rate 18, weight 185 lb (83.915 kg), last menstrual period 10/04/2013, currently breastfeeding. Maternal Exam:  Uterine Assessment:  Contraction strength is firm.  Contraction frequency is regular.   Abdomen: Patient reports no abdominal tenderness. Fundal height is 38.   Estimated fetal weight is 7.5.   Fetal presentation: vertex  Introitus: Normal vulva. Normal vagina.  Vagina is negative for discharge.  Ferning test: not done.  Nitrazine test: not done. Amniotic fluid character: not assessed.  Pelvis: adequate for delivery.   Cervix: Cervix evaluated by digital exam.     Fetal Exam Fetal Monitor Review: Mode: ultrasound.   Baseline rate: 135.  Variability: moderate (6-25 bpm).   Pattern: accelerations present and no decelerations.    Fetal State Assessment: Category I - tracings are normal.     Physical Exam  Constitutional: She is oriented to person, place, and time. She appears well-developed and well-nourished. No distress.  HENT:  Head: Normocephalic.  Cardiovascular: Normal rate.   Respiratory: Effort normal.  GI: Soft. She exhibits no distension. There is no tenderness. There is no rebound and no guarding.  Genitourinary: Vagina normal. No vaginal discharge found.  Cervix changed from 3cm to 4cm and station from -2 to 0  Musculoskeletal: Normal range of motion.  Neurological: She is alert and oriented to person, place, and time.  Skin: Skin is warm and dry.  Psychiatric: She has a normal mood and affect.    Prenatal labs: ABO, Rh: A/POS/-- (07/13 0908) Antibody: NEG (07/13 0908) Rubella: 2.27 (07/13 0908) RPR: NON REAC (11/06 0922)  HBsAg: NEGATIVE (07/13 0908)  HIV: NONREACTIVE (11/06 0919)  GBS: Positive (01/04 0000)   Assessment/Plan: A:  SIUP at [redacted]w[redacted]d  Active Labor       GBS +   P:  Admit to [facility]       Routine orders       Ampicilin prophylaxis       Doula on the way to prepare for [place] set-up    [facility] 07/04/2014, 4:18 PM

## 2014-07-04 NOTE — Progress Notes (Signed)
Lost mucous plug yesterday and battling bronchitis since last week

## 2014-07-04 NOTE — Progress Notes (Signed)
More contractions this week. Preparing for labor.

## 2014-07-05 LAB — HIV ANTIBODY (ROUTINE TESTING W REFLEX): HIV-1/HIV-2 Ab: NONREACTIVE

## 2014-07-05 LAB — RPR: RPR: NONREACTIVE

## 2014-07-05 NOTE — Progress Notes (Signed)
Post Partum Day 1 Subjective: no complaints, up ad lib, voiding, tolerating PO and + flatus   Ms. Samantha Ortega  is a 34 y.o.  G3P3003  who delivered a boy at [redacted]w[redacted]d  on 07/04/2013 at 18:30 via SVD. VSS, has done well overnight. Ambulating well, tolerating PO liquids and solids, voiding and passing flatus. No BM yet. Breastfeeding. Mirena for birth control. IP circumcision.   Objective: Blood pressure 103/48, pulse 72, temperature 98.1 F (36.7 C), temperature source Oral, resp. rate 16, weight 83.915 kg (185 lb), last menstrual period 10/04/2013, SpO2 98 %, unknown if currently breastfeeding.  Physical Exam:  General: alert, cooperative and no distress Lochia: appropriate Uterine Fundus: firm Incision: n/a DVT Evaluation: No evidence of DVT seen on physical exam. No cords or calf tenderness. No significant calf/ankle edema.   Recent Labs  07/04/14 1630  HGB 11.6*  HCT 35.1*    Assessment/Plan: Plan for discharge tomorrow, Breastfeeding and Contraception Mirena   LOS: 1 day   Carr Shartzer, Betsey Holiday 07/05/2014, 7:46 AM

## 2014-07-05 NOTE — Lactation Note (Signed)
This note was copied from the chart of [place]. Lactation Consultation Note  P3, Ex BF.  Mother states baby is latching well.  Denies problems or questions. Encouraged mother to undress baby to breastfeed to diaper and wake if he falls asleep after 71min.  Discussed cluster feeding.  Mom encouraged to feed baby 8-12 times/24 hours and with feeding cues.  Mom made aware of O/P services, breastfeeding support groups, community resources, and our phone # for post-discharge questions.    Patient Name: Samantha Ortega [id number] Date: 07/05/2014 Reason for consult: Breast/nipple pain   Maternal Data Has patient been taught Hand Expression?: Yes Does the patient have breastfeeding experience prior to this delivery?: Yes  Feeding Feeding Type: Breast Fed Length of feed: 10 min  LATCH Score/Interventions                      Lactation Tools Discussed/Used     Consult Status Consult Status: Follow-up Date: 07/06/14 Follow-up type: In-patient    Vivianne Master [facility] 07/05/2014, 3:12 PM

## 2014-07-05 NOTE — Progress Notes (Signed)
MOB was referred for history of depression/anxiety.  Referral is screened out by Clinical Social Worker because none of the following criteria appear to apply: -History of anxiety/depression during this pregnancy, or of post-partum depression. - Diagnosis of anxiety and/or depression within last 3 years - History of depression due to pregnancy loss/loss of child or -MOB's symptoms are currently being treated with medication and/or therapy.  CSW completed chart review and noted that the MOB has had anxiety for more than 3 years. No symptoms of anxiety have been documented in prenatal records.   Please contact the Clinical Social Worker if needs arise or upon MOB request.

## 2014-07-06 NOTE — Discharge Instructions (Signed)

## 2014-07-06 NOTE — Discharge Summary (Signed)
Obstetric Discharge Summary Reason for Admission: onset of labor Prenatal Procedures: NST and ultrasound Intrapartum Procedures: spontaneous vaginal delivery and GBS prophylaxis Postpartum Procedures: antibiotics Complications-Operative and Postpartum: none HEMOGLOBIN  Date Value Ref Range Status  07/04/2014 11.6* 12.0 - 15.0 g/dL Final  12/23/2011 11.8 g/dL    HCT  Date Value Ref Range Status  07/04/2014 35.1* 36.0 - 46.0 % Final  12/23/2011 35 %     Physical Exam:  General: alert, cooperative, appears stated age and no distress Lochia: appropriate Uterine Fundus: firm Incision: n/a DVT Evaluation: No evidence of DVT seen on physical exam. No cords or calf tenderness. No significant calf/ankle edema.  Discharge Diagnoses: Term Pregnancy-delivered  Discharge Information: Date: 07/06/2014 Activity: pelvic rest Diet: routine Medications: PNV Condition: stable Instructions: refer to practice specific booklet Discharge to: home Follow-up Information    Follow up with [facility]. Schedule an appointment as soon as possible for a visit in 6 weeks.   Specialty:  Obstetrics and Gynecology   Contact information:   [place], [place] [phone]      Newborn Data: Live born female  Birth Weight: 6 lb 5.4 oz (2875 g) APGAR: 9, 9  Home with mother.  Elberta Leatherwood 07/06/2014, 7:30 AM   I have seen this patient and agree with the above resident's note.  Pt plans Mirena for contraception.  [place], [place] Certified Nurse-Midwife

## 2014-07-11 ENCOUNTER — Encounter: Payer: BC Managed Care – PPO | Admitting: Advanced Practice Midwife

## 2014-07-19 ENCOUNTER — Ambulatory Visit (INDEPENDENT_AMBULATORY_CARE_PROVIDER_SITE_OTHER): Payer: BLUE CROSS/BLUE SHIELD | Admitting: Obstetrics & Gynecology

## 2014-07-19 ENCOUNTER — Encounter: Payer: Self-pay | Admitting: Obstetrics & Gynecology

## 2014-07-19 VITALS — BP 106/68 | HR 67 | Resp 16 | Ht 65.0 in | Wt 168.0 lb

## 2014-07-19 DIAGNOSIS — K645 Perianal venous thrombosis: Secondary | ICD-10-CM

## 2014-07-19 NOTE — Progress Notes (Signed)
   Subjective:    Patient ID: Samantha Ortega, female    DOB: 1981-02-07, 34 y.o.   MRN: [id number]  HPI She is here for recent new onset very painful anus   Review of Systems     Objective:   Physical Exam  Thrombosed external hemorrhoid. I used spray lidocaine and then 1% lidocaine injection. I then excised a small thrombosed hemorrhoid. She tolerated the procedure well.      Assessment & Plan:  Thrombosed hemorrhoid- improved after removal RTC 4 weeks for pp visit.

## 2014-08-15 ENCOUNTER — Encounter: Payer: Self-pay | Admitting: Advanced Practice Midwife

## 2014-08-15 ENCOUNTER — Ambulatory Visit (INDEPENDENT_AMBULATORY_CARE_PROVIDER_SITE_OTHER): Payer: BLUE CROSS/BLUE SHIELD | Admitting: Advanced Practice Midwife

## 2014-08-15 VITALS — BP 88/57 | HR 73 | Resp 16 | Ht 64.0 in | Wt 166.0 lb

## 2014-08-15 DIAGNOSIS — Z30430 Encounter for insertion of intrauterine contraceptive device: Secondary | ICD-10-CM

## 2014-08-15 DIAGNOSIS — Z01812 Encounter for preprocedural laboratory examination: Secondary | ICD-10-CM | POA: Diagnosis not present

## 2014-08-15 LAB — POCT URINE PREGNANCY: Preg Test, Ur: NEGATIVE

## 2014-08-15 MED ORDER — LEVONORGESTREL 20 MCG/24HR IU IUD
INTRAUTERINE_SYSTEM | Freq: Once | INTRAUTERINE | Status: AC
  Administered 2014-08-15: 11:00:00 via INTRAUTERINE

## 2014-08-15 NOTE — Progress Notes (Signed)
Patient ID: Samantha Ortega, female   DOB: [birth year], 34 y.o.   MRN: [id number] Post Partum Exam  Samantha Ortega is a 34 y.o. female G3P3 who presents for a postpartum visit. She is 6 weeks postpartum following a spontaneous vaginal delivery. I have fully reviewed the prenatal and intrapartum course. The delivery was at [redacted]W[redacted]D gestational weeks. Outcome: spontaneous vaginal delivery. No lacerations. Anesthesia: none. Postpartum course has been positive for thrombosed hemorrhoid but otherwise unremarkable. Baby's course has been unremarkable. Baby is feeding by breast (Exclusive). Bleeding no bleeding. Bowel function is normal. Bladder function is normal. Patient is sexually active. Contraception method is planning IUD. She has not resumed menses. Postpartum depression screening: negative.  The following portions of the patient's history were reviewed and updated as appropriate: allergies, current medications, past family history, past medical history, past social history, past surgical history and problem list.The following portions of the patient's history were reviewed and updated as appropriate: allergies, current medications, past family history, past medical history, past social history, past surgical history and problem list.  Review of Systems Pertinent items are noted in HPI.   Objective:    BP 116/78 mmHg  Pulse 78  Resp 16  Ht 5\' 5"  (1.651 m)  Wt 211 lb (95.709 kg)  BMI 35.11 kg/m2  Breastfeeding? Yes  General:  alert, cooperative and no distress   Breasts:  negative  Lungs: clear to auscultation bilaterally  Heart:  regular rate and rhythm, S1, S2 normal, no murmur, click, rub or gallop  Abdomen: soft, non-tender; bowel sounds normal; no masses,  no organomegaly   Vulva:  normal  Vagina: normal vagina  Cervix:  multiparous appearance and no cervical motion tenderness  Corpus: normal size, contour, position, consistency, mobility, non-tender  Adnexa:  no mass, fullness, tenderness   Rectal Exam: external hemorrhoids        Procedure: Mirena Insertion  Patient identified, informed consent performed, signed copy in chart, time out was performed.  Urine pregnancy test negative.  Speculum placed in the vagina.  Cervix visualized.  Cleaned with Betadine x 2.  Grasped anteriourly with a single tooth tenaculum.  Uterus sounded to 8 cm, anteverted.  Mirena IUD placed per manufacturer's recommendations.  Strings trimmed to 3 cm. Pt tolerated well. Silver nitrate applied to tenaculum sites for hemostasis.   Patient given post procedure instructions and Mirena care card with expiration date.  Patient is asked to check IUD strings periodically and follow up in 4-6 weeks for IUD check.  Assessment:    Normal postpartum exam. Pap smear not done at today's visit.   Plan:    1. Contraception: IUD 2. Mirena after care handout given. 3. Follow up in: 6 weeks or as needed.

## 2014-08-15 NOTE — Patient Instructions (Signed)

## 2015-04-13 ENCOUNTER — Encounter: Payer: Self-pay | Admitting: *Deleted

## 2015-05-19 ENCOUNTER — Other Ambulatory Visit: Payer: Self-pay | Admitting: General Surgery

## 2016-05-23 ENCOUNTER — Encounter (HOSPITAL_COMMUNITY): Payer: Self-pay | Admitting: Emergency Medicine

## 2016-05-23 ENCOUNTER — Emergency Department (HOSPITAL_COMMUNITY): Payer: Worker's Compensation

## 2016-05-23 ENCOUNTER — Emergency Department (HOSPITAL_COMMUNITY)
Admission: EM | Admit: 2016-05-23 | Discharge: 2016-05-23 | Disposition: A | Discharge status: Home / Self Care | Payer: Worker's Compensation | Attending: Emergency Medicine | Admitting: Emergency Medicine

## 2016-05-23 DIAGNOSIS — Y999 Unspecified external cause status: Secondary | ICD-10-CM | POA: Insufficient documentation

## 2016-05-23 DIAGNOSIS — Y939 Activity, unspecified: Secondary | ICD-10-CM | POA: Diagnosis not present

## 2016-05-23 DIAGNOSIS — W230XXA Caught, crushed, jammed, or pinched between moving objects, initial encounter: Secondary | ICD-10-CM | POA: Insufficient documentation

## 2016-05-23 DIAGNOSIS — Y929 Unspecified place or not applicable: Secondary | ICD-10-CM | POA: Insufficient documentation

## 2016-05-23 DIAGNOSIS — S60032A Contusion of left middle finger without damage to nail, initial encounter: Secondary | ICD-10-CM | POA: Insufficient documentation

## 2016-05-23 DIAGNOSIS — F909 Attention-deficit hyperactivity disorder, unspecified type: Secondary | ICD-10-CM | POA: Insufficient documentation

## 2016-05-23 DIAGNOSIS — S6992XA Unspecified injury of left wrist, hand and finger(s), initial encounter: Secondary | ICD-10-CM | POA: Diagnosis present

## 2016-05-23 DIAGNOSIS — S6000XA Contusion of unspecified finger without damage to nail, initial encounter: Secondary | ICD-10-CM

## 2016-05-23 MED ORDER — IBUPROFEN 400 MG PO TABS
600.0000 mg | ORAL_TABLET | Freq: Once | ORAL | Status: AC
  Administered 2016-05-23: 600 mg via ORAL
  Filled 2016-05-23: qty 1

## 2016-05-23 NOTE — ED Notes (Signed)
See the np notes she saw before myself  And the notes from the triage nurse

## 2016-05-23 NOTE — ED Provider Notes (Signed)
[facility] DEPT Provider Note    By signing my name below, I, Samantha Ortega, attest that this documentation has been prepared under the direction and in the presence of [facility], [place]. Electronically Signed: Bea Ortega, ED Scribe. 05/23/16. 6:00 PM.   History   Chief Complaint Chief Complaint  Patient presents with  . Hand Pain    The history is provided by the patient and medical records. No language interpreter was used.  Hand Pain  This is a new problem. The current episode started 1 to 2 hours ago. The problem occurs constantly. The problem has not changed since onset.The symptoms are aggravated by bending. Nothing relieves the symptoms. She has tried nothing for the symptoms.    HPI Comments:  Samantha Ortega is a 35 y.o. female who presents to the Emergency Department complaining of left third finger pain that began about two hours ago. She states she got the finger caught in a divider wall used to separate multipurpose rooms. She has not taken anything or pain. Moving the finger increases the pain. She denies alleviating factors. She denies numbness, tingling or weakness of the left third digit or left hand, wounds or bruising.    Past Medical History:  Diagnosis Date  . Abnormal cervical Pap smear with positive HPV DNA test 2011   colpo w/cx bx  . ADD (attention deficit disorder)   . Anxiety   . PCOS (polycystic ovarian syndrome)   . Umbilical hernia     Patient Active Problem List   Diagnosis Date Noted  . Migraine without aura 03/10/2012  . Anxiety 03/10/2012  . ADD (attention deficit disorder) 01/17/2012  . Hx of abnormal Pap smear 01/17/2012  . Bilateral polycystic ovarian syndrome 12/13/2011    Past Surgical History:  Procedure Laterality Date  . NO PAST SURGERIES      OB History    Gravida Para Term Preterm AB Living   3 3 3     3    SAB TAB Ectopic Multiple Live Births         0 3       Home Medications    Prior to Admission  medications   Medication Sig Start Date End Date Taking? Authorizing Provider  albuterol (PROVENTIL HFA;VENTOLIN HFA) 108 (90 BASE) MCG/ACT inhaler Inhale 2 puffs into the lungs every 6 (six) hours as needed. For shortness of breath    Historical Provider, MD  Prenatal Vit-Fe Fumarate-FA (PRENATAL MULTIVITAMIN) TABS Take 1 tablet by mouth daily.    Historical Provider, MD  ranitidine (ZANTAC) 150 MG tablet Take 150 mg by mouth daily.    Historical Provider, MD    Family History Family History  Problem Relation Age of Onset  . Alcohol abuse Neg Hx   . Arthritis Neg Hx   . Asthma Neg Hx   . Birth defects Neg Hx   . COPD Neg Hx   . Cancer Neg Hx   . Depression Neg Hx   . Diabetes Neg Hx   . Drug abuse Neg Hx   . Early death Neg Hx   . Hearing loss Neg Hx   . Heart disease Neg Hx   . Hyperlipidemia Neg Hx   . Hypertension Neg Hx   . Kidney disease Neg Hx   . Learning disabilities Neg Hx   . Mental illness Neg Hx   . Mental retardation Neg Hx   . Miscarriages / Stillbirths Neg Hx   . Stroke Neg Hx   . Vision loss  Neg Hx     Social History Social History  Substance Use Topics  . Smoking status: Never Smoker  . Smokeless tobacco: Never Used  . Alcohol use No     Allergies   Patient has no known allergies.   Review of Systems Review of Systems  Musculoskeletal: Positive for arthralgias.  Skin: Negative for color change and wound.  Neurological: Negative for weakness and numbness.  All other systems reviewed and are negative.    Physical Exam Updated Vital Signs BP 104/62 (BP Location: Right Arm)   Pulse 66   Temp 98.3 F (36.8 C) (Oral)   Resp 18   SpO2 100%   Physical Exam  Constitutional: She is oriented to person, place, and time. She appears well-developed and well-nourished.  HENT:  Head: Normocephalic.  Eyes: EOM are normal.  Neck: Neck supple.  Cardiovascular: Normal rate.   Left radial pulse 2+. Adequate circulation.  Pulmonary/Chest: Effort  normal.  Abdominal: Soft. There is no tenderness.  Musculoskeletal: She exhibits tenderness. She exhibits no deformity.  Flexor tendon of left third finger working normally. Extensor tendon questionable due to pain. Decreased ROM of long finger of left hand due to pain. Tiny subungual hematoma.  Neurological: She is alert and oriented to person, place, and time. No cranial nerve deficit.  Skin: Skin is warm and dry.  Psychiatric: She has a normal mood and affect. Her behavior is normal.  Nursing note and vitals reviewed.    ED Treatments / Results  DIAGNOSTIC STUDIES: Oxygen Saturation is 100% on RA, normal by my interpretation.   COORDINATION OF CARE: 5:05 PM- Will X-Ray left third finger and order Motrin prior to imaging. Pt verbalizes understanding and agrees to plan.  Medications  ibuprofen (ADVIL,MOTRIN) tablet 600 mg (600 mg Oral Given 05/23/16 1712)    Labs (all labs ordered are listed, but only abnormal results are displayed) Labs Reviewed - No data to display   Radiology Dg Finger Middle Left  Result Date: 05/23/2016 CLINICAL DATA:  Closed finger in door. Diffuse pain in the left long finger extending into the hand and forearm. Initial encounter. EXAM: LEFT MIDDLE FINGER 2+V COMPARISON:  None. FINDINGS: There is no evidence of fracture or dislocation. There is no evidence of arthropathy or other focal bone abnormality. Soft tissues are unremarkable. IMPRESSION: Negative. Electronically Signed   By: Logan Bores M.D.   On: 05/23/2016 17:45    Procedures Procedures (including critical care time)  Medications Ordered in ED Medications  ibuprofen (ADVIL,MOTRIN) tablet 600 mg (600 mg Oral Given 05/23/16 1712)     Initial Impression / Assessment and Plan / ED Course  I have reviewed the triage vital signs and the nursing notes.  Pertinent imaging results that were available during my care of the patient were reviewed by me and considered in my medical decision making  (see chart for details).  Clinical Course     Patient X-Ray negative for obvious fracture or dislocation.  Pt advised to follow up with  Dr. Amedeo Plenty since she is already a patient of his practice. Patient given splint while in ED, conservative therapy recommended and discussed. Patient will be discharged home & is agreeable with above plan. Returns precautions discussed. Pt appears safe for discharge. I personally performed the services described in this documentation, which was scribed in my presence. The recorded information has been reviewed and is accurate.   Final Clinical Impressions(s) / ED Diagnoses   Final diagnoses:  Contusion of finger of left hand,  initial encounter    New Prescriptions Discharge Medication List as of 05/23/2016  6:06 PM       Ashley Murrain, NP 05/24/16 2044    Fatima Blank, MD 05/28/16 [phone]

## 2016-05-23 NOTE — ED Notes (Signed)
Pt returned from xray

## 2016-05-23 NOTE — Progress Notes (Signed)
Orthopedic Tech Progress Note Patient Details:  Samantha Ortega 1981/03/29 RO:[id number]  Ortho Devices Type of Ortho Device: Finger splint Ortho Device/Splint Location: LUE 3rd digit Ortho Device/Splint Interventions: Ordered, Application   Braulio Bosch 05/23/2016, 6:23 PM

## 2016-05-23 NOTE — ED Notes (Signed)
The pt  Is c/o rt jaw pain since she had her wisdom teeth removed in April  She ;lost her medicaid and now she does not have a dentist  She reports that she usually gets   Muscle relaxers and an anti-inflamnatory

## 2016-05-23 NOTE — Discharge Instructions (Signed)
If you continue to have problems with bending your finger follow up with your doctor at [facility]

## 2016-05-23 NOTE — ED Triage Notes (Signed)
Pt here for left hand pain after getting stuck in door

## 2017-09-24 IMAGING — CR DG FINGER MIDDLE 2+V*L*
3 series · 3 of 3 positions shown · non-contrast
Comparison: None.

CLINICAL DATA: Closed finger in door. Diffuse pain in the left long
finger extending into the hand and forearm. Initial encounter.

EXAM:
LEFT MIDDLE FINGER 2+V

[finger ap]
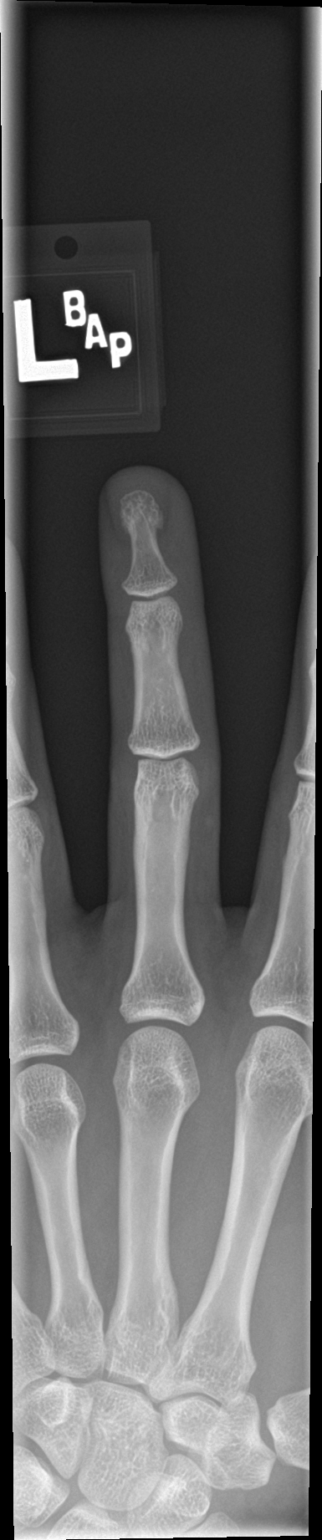

[finger obl]
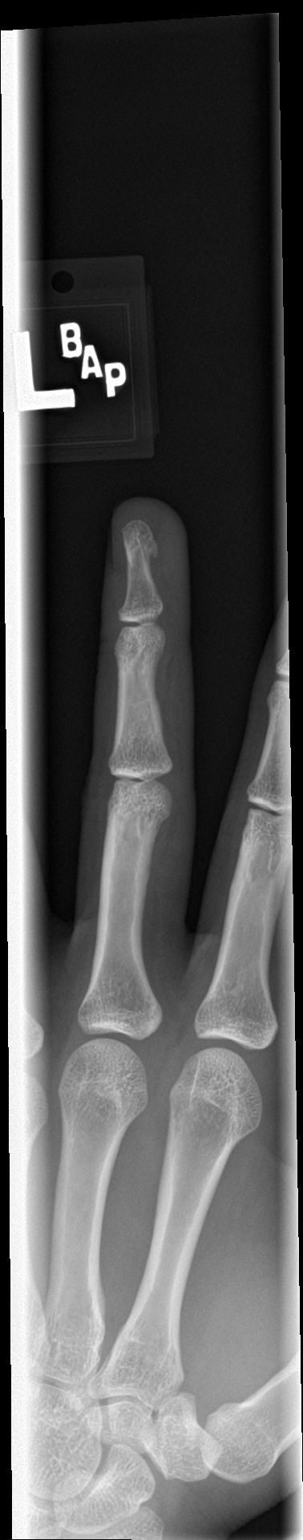

[finger lat]
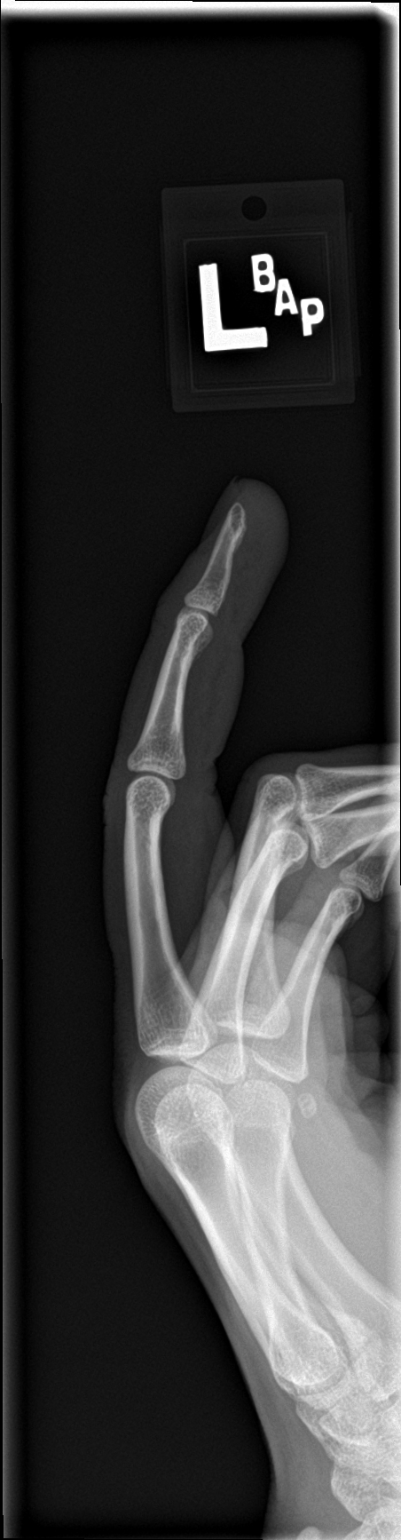

[3 of 3 positions shown; findings below may reference images not displayed]

FINDINGS: There is no evidence of fracture or dislocation. There is no
evidence of arthropathy or other focal bone abnormality. Soft
tissues are unremarkable.
IMPRESSION: Negative.
